# Patient Record
Sex: Male | Born: 1965 | Race: White | Marital: Single | Smoking: Current every day smoker
Health system: Southern US, Community
[De-identification: ages and names within clinical notes are randomized; demographics above are authoritative.]

## PROBLEM LIST (undated history)

## (undated) DIAGNOSIS — I10 Essential (primary) hypertension: Secondary | ICD-10-CM

## (undated) DIAGNOSIS — Q798 Other congenital malformations of musculoskeletal system: Secondary | ICD-10-CM

## (undated) DIAGNOSIS — F329 Major depressive disorder, single episode, unspecified: Secondary | ICD-10-CM

## (undated) DIAGNOSIS — F32A Depression, unspecified: Secondary | ICD-10-CM

## (undated) DIAGNOSIS — K219 Gastro-esophageal reflux disease without esophagitis: Secondary | ICD-10-CM

## (undated) DIAGNOSIS — F419 Anxiety disorder, unspecified: Secondary | ICD-10-CM

## (undated) DIAGNOSIS — F1110 Opioid abuse, uncomplicated: Secondary | ICD-10-CM

## (undated) DIAGNOSIS — E785 Hyperlipidemia, unspecified: Secondary | ICD-10-CM

## (undated) HISTORY — PX: HAND SURGERY: SHX662

## (undated) HISTORY — PX: TONSILLECTOMY: SUR1361

---

## 2010-12-31 ENCOUNTER — Other Ambulatory Visit: Payer: Self-pay | Admitting: Nephrology

## 2010-12-31 ENCOUNTER — Ambulatory Visit
Admission: RE | Admit: 2010-12-31 | Discharge: 2010-12-31 | Disposition: A | Discharge status: Home / Self Care | Payer: 59 | Source: Ambulatory Visit | Attending: Nephrology | Admitting: Nephrology

## 2010-12-31 DIAGNOSIS — F17200 Nicotine dependence, unspecified, uncomplicated: Secondary | ICD-10-CM

## 2011-08-24 ENCOUNTER — Ambulatory Visit
Admission: RE | Admit: 2011-08-24 | Discharge: 2011-08-24 | Disposition: A | Discharge status: Home / Self Care | Payer: BC Managed Care – PPO | Source: Ambulatory Visit | Attending: Nephrology | Admitting: Nephrology

## 2011-08-24 ENCOUNTER — Other Ambulatory Visit: Payer: Self-pay | Admitting: Nephrology

## 2011-08-24 DIAGNOSIS — R52 Pain, unspecified: Secondary | ICD-10-CM

## 2012-01-04 ENCOUNTER — Emergency Department (HOSPITAL_COMMUNITY)
Admission: EM | Admit: 2012-01-04 | Discharge: 2012-01-05 | Disposition: A | Discharge status: Other Acute Inpatient Hospital | Payer: BC Managed Care – PPO | Attending: Emergency Medicine | Admitting: Emergency Medicine

## 2012-01-04 ENCOUNTER — Encounter (HOSPITAL_COMMUNITY): Payer: Self-pay | Admitting: *Deleted

## 2012-01-04 DIAGNOSIS — F17200 Nicotine dependence, unspecified, uncomplicated: Secondary | ICD-10-CM | POA: Insufficient documentation

## 2012-01-04 DIAGNOSIS — F3289 Other specified depressive episodes: Secondary | ICD-10-CM | POA: Insufficient documentation

## 2012-01-04 DIAGNOSIS — F329 Major depressive disorder, single episode, unspecified: Secondary | ICD-10-CM | POA: Insufficient documentation

## 2012-01-04 DIAGNOSIS — Z79899 Other long term (current) drug therapy: Secondary | ICD-10-CM | POA: Insufficient documentation

## 2012-01-04 LAB — CBC
MCV: 89.5 fL (ref 78.0–100.0)
Platelets: 307 10*3/uL (ref 150–400)

## 2012-01-04 LAB — RAPID URINE DRUG SCREEN, HOSP PERFORMED
Barbiturates: NOT DETECTED
Opiates: NOT DETECTED
Tetrahydrocannabinol: POSITIVE — AB

## 2012-01-04 LAB — COMPREHENSIVE METABOLIC PANEL
AST: 18 U/L (ref 0–37)
CO2: 29 mEq/L (ref 19–32)
Chloride: 98 mEq/L (ref 96–112)
Creatinine, Ser: 0.8 mg/dL (ref 0.50–1.35)
GFR calc non Af Amer: >90 mL/min (ref >90)
Total Bilirubin: 0.4 mg/dL (ref 0.3–1.2)

## 2012-01-04 MED ORDER — IBUPROFEN 200 MG PO TABS: 600.0000 mg | ORAL_TABLET | Freq: Three times a day (TID) | ORAL | Status: DC | PRN

## 2012-01-04 MED ORDER — ATORVASTATIN CALCIUM 10 MG PO TABS
10.0000 mg | ORAL_TABLET | Freq: Every day | ORAL | Status: DC
  Administered 2012-01-05: 10 mg via ORAL
  Filled 2012-01-04 (×2): qty 1

## 2012-01-04 MED ORDER — ONDANSETRON HCL 4 MG PO TABS: 4.0000 mg | ORAL_TABLET | Freq: Three times a day (TID) | ORAL | Status: DC | PRN

## 2012-01-04 MED ORDER — LORAZEPAM 1 MG PO TABS
1.0000 mg | ORAL_TABLET | Freq: Three times a day (TID) | ORAL | Status: DC | PRN
  Administered 2012-01-05 (×2): 1 mg via ORAL
  Filled 2012-01-04 (×2): qty 1

## 2012-01-04 MED ORDER — ZOLPIDEM TARTRATE 5 MG PO TABS: 5.0000 mg | ORAL_TABLET | Freq: Every evening | ORAL | Status: DC | PRN

## 2012-01-04 MED ORDER — QUETIAPINE FUMARATE 100 MG PO TABS
100.0000 mg | ORAL_TABLET | Freq: Every day | ORAL | Status: DC
  Administered 2012-01-05: 100 mg via ORAL
  Filled 2012-01-04: qty 1

## 2012-01-04 MED ORDER — NICOTINE 21 MG/24HR TD PT24
21.0000 mg | MEDICATED_PATCH | Freq: Every day | TRANSDERMAL | Status: DC
  Administered 2012-01-05: 21 mg via TRANSDERMAL
  Filled 2012-01-04 (×2): qty 1

## 2012-01-04 MED ORDER — POTASSIUM CHLORIDE CRYS ER 20 MEQ PO TBCR
40.0000 meq | EXTENDED_RELEASE_TABLET | Freq: Once | ORAL | Status: AC
  Administered 2012-01-04: 40 meq via ORAL
  Filled 2012-01-04: qty 2

## 2012-01-04 MED ORDER — PANTOPRAZOLE SODIUM 40 MG PO TBEC
80.0000 mg | DELAYED_RELEASE_TABLET | Freq: Every day | ORAL | Status: DC
  Administered 2012-01-04 – 2012-01-05 (×2): 80 mg via ORAL
  Filled 2012-01-04 (×2): qty 2

## 2012-01-04 MED ORDER — VILAZODONE HCL 10 MG PO TABS
10.0000 mg | ORAL_TABLET | Freq: Every day | ORAL | Status: DC
  Administered 2012-01-05: 10 mg via ORAL
  Filled 2012-01-04: qty 1

## 2012-01-04 MED ORDER — VILAZODONE HCL 40 MG PO TABS: 40.0000 mg | ORAL_TABLET | Freq: Every day | ORAL | Status: DC

## 2012-01-04 MED ORDER — VILAZODONE HCL 20 MG PO TABS: 20.0000 mg | ORAL_TABLET | Freq: Every day | ORAL | Status: DC

## 2012-01-04 NOTE — ED Provider Notes (Signed)
History     CSN: [id number]  Arrival date & time 01/04/12  1858   First MD Initiated Contact with Patient 01/04/12 2203      Chief Complaint  Patient presents with  . Medical Clearance    (Consider location/radiation/quality/duration/timing/severity/associated sxs/prior treatment) HPI History from patient. 46 year old male presents for medical clearance. He has a past medical history of depression. He reports that he has been abusing prescription narcotics, especially oxycodone, for the past 3 years now. He has tried to "get clean" on his own for the past several weeks but hasn't been able to do so. He reports that he has felt increasingly depressed because he feels as if "I have a lot to lose if I don't get clean." He denies any suicidal or homicidal ideation at this time. States that he recently saw psychiatry on Friday of this week and was started on Viibryd for his depression. He has taken this for the past 3 days as prescribed. Reports he has had a decreased appetite for the past several days which is due to withdrawal. He reports chills as well. Denies any other somatic complaints at this time, specifically no cough, congestion, chest or abdominal pain, urinary symptoms, skin infection, weight loss, night sweats. History reviewed. No pertinent past medical history.  History reviewed. No pertinent past surgical history.  No family history on file.  History  Substance Use Topics  . Smoking status: Current Everyday Smoker -- 2.0 packs/day  . Smokeless tobacco: Not on file  . Alcohol Use: No      Review of Systems  All other systems reviewed and are negative.    Allergies  Review of patient's allergies indicates no known allergies.  Home Medications   Current Outpatient Rx  Name Route Sig Dispense Refill  . ATORVASTATIN CALCIUM 20 MG PO TABS Oral Take 10 mg by mouth daily.    Marland Kitchen CLONAZEPAM 1 MG PO TABS Oral Take 1 mg by mouth 3 (three) times daily as needed. For anxiety     . OMEPRAZOLE 40 MG PO CPDR Oral Take 40 mg by mouth daily.    . OXYCODONE HCL 15 MG PO TABS Oral Take 15 mg by mouth 5 (five) times daily.    . QUETIAPINE FUMARATE 100 MG PO TABS Oral Take 100-200 mg by mouth daily.    Marland Kitchen VILAZODONE HCL 10 MG PO TABS Oral Take 10-40 mg by mouth See admin instructions. Started pack days 1-7 take 10 mg then take 20 mg days 8-14 then 40 mg days 15-30.      BP 146/102  Pulse 99  Temp 98.4 F (36.9 C) (Oral)  Resp 26  Ht 5\' 9"  (1.753 m)  Wt 140 lb (63.504 kg)  BMI 20.67 kg/m2  SpO2 100%  Physical Exam  Nursing note and vitals reviewed. Constitutional: He is oriented to person, place, and time. He appears well-developed and well-nourished. No distress.  HENT:  Head: Normocephalic and atraumatic.  Mouth/Throat: Oropharynx is clear and moist. No oropharyngeal exudate.  Eyes: EOM are normal. Pupils are equal, round, and reactive to light.       Normal appearance  Neck: Normal range of motion. Neck supple.  Cardiovascular: Normal rate, regular rhythm and normal heart sounds.  Exam reveals no gallop and no friction rub.   No murmur heard. Pulmonary/Chest: Effort normal and breath sounds normal. He has no wheezes. He exhibits no tenderness.  Abdominal: Soft. Bowel sounds are normal. There is no tenderness. There is no rebound and no  guarding.  Musculoskeletal: Normal range of motion.  Lymphadenopathy:    He has no cervical adenopathy.  Neurological: He is alert and oriented to person, place, and time. No cranial nerve deficit.  Skin: Skin is warm and dry. He is not diaphoretic.  Psychiatric: He has a normal mood and affect.    ED Course  Procedures (including critical care time)  Labs Reviewed  CBC - Abnormal; Notable for the following:    WBC 20.3 (*)     All other components within normal limits  COMPREHENSIVE METABOLIC PANEL - Abnormal; Notable for the following:    Potassium 2.9 (*)     Glucose, Bld 107 (*)     BUN 3 (*)     All other  components within normal limits  URINE RAPID DRUG SCREEN (HOSP PERFORMED) - Abnormal; Notable for the following:    Tetrahydrocannabinol POSITIVE (*)     All other components within normal limits  ETHANOL   No results found.   No diagnosis found.    MDM  Patient is medically cleared for evaluation by the ACT team. I discussed with Terri. Patient does have an elevated white count, but both the patient and his wife report that this is a chronic issue and his levels have been elevated during his annual physicals for the past several years. No old values in the chart here to be compared to.He has had evaluation by his PCP without a definitive etiology discovered. Patient denies any somatic symptoms that would suggest infection or oncologic process. Will put in an order for repeat CBC to be drawn in the morning to evaluate for trending. Patient calm and cooperative at this time and verbalizes understanding of the process.        Grant Fontana, PA-C 01/04/12 2256  Grant Fontana, PA-C 01/04/12 2257

## 2012-01-04 NOTE — ED Notes (Signed)
Pt placed in scrubs; valuables and meds given to wife to take home; pt wanded by security

## 2012-01-04 NOTE — ED Notes (Signed)
Pt requesting help for detox from oxycodone; states is depressed; denies suicidal ideations.  Saw psychiatrist on Friday and restarted on meds Viibryd/seroquel

## 2012-01-04 NOTE — ED Notes (Signed)
Pt wants helps with medication and with not eating.  Pt states he has been addictive to medication for three years.  He has been feeling depressed and not wanting to get out the house.  Pt states he is feeling severely depressed.

## 2012-01-05 ENCOUNTER — Inpatient Hospital Stay (HOSPITAL_COMMUNITY)
Admission: AD | Admit: 2012-01-05 | Discharge: 2012-01-10 | DRG: 745 | Disposition: A | Discharge status: Home / Self Care | Payer: BC Managed Care – PPO | Source: Ambulatory Visit | Attending: Psychiatry | Admitting: Psychiatry

## 2012-01-05 ENCOUNTER — Encounter (HOSPITAL_COMMUNITY): Payer: Self-pay

## 2012-01-05 DIAGNOSIS — F19939 Other psychoactive substance use, unspecified with withdrawal, unspecified: Principal | ICD-10-CM | POA: Diagnosis present

## 2012-01-05 DIAGNOSIS — F1120 Opioid dependence, uncomplicated: Secondary | ICD-10-CM | POA: Diagnosis present

## 2012-01-05 DIAGNOSIS — F329 Major depressive disorder, single episode, unspecified: Secondary | ICD-10-CM | POA: Diagnosis present

## 2012-01-05 DIAGNOSIS — F1210 Cannabis abuse, uncomplicated: Secondary | ICD-10-CM | POA: Diagnosis present

## 2012-01-05 DIAGNOSIS — F3289 Other specified depressive episodes: Secondary | ICD-10-CM | POA: Diagnosis present

## 2012-01-05 HISTORY — DX: Depression, unspecified: F32.A

## 2012-01-05 HISTORY — DX: Major depressive disorder, single episode, unspecified: F32.9

## 2012-01-05 LAB — BASIC METABOLIC PANEL
GFR calc Af Amer: >90 mL/min (ref >90)
GFR calc non Af Amer: >90 mL/min (ref >90)
Glucose, Bld: 137 mg/dL — ABNORMAL HIGH (ref 70–99)
Potassium: 3.5 mEq/L (ref 3.5–5.1)
Sodium: 137 mEq/L (ref 135–145)

## 2012-01-05 MED ORDER — ONDANSETRON 4 MG PO TBDP: 4.0000 mg | ORAL_TABLET | Freq: Four times a day (QID) | ORAL | Status: DC | PRN

## 2012-01-05 MED ORDER — ACETAMINOPHEN 325 MG PO TABS: 650.0000 mg | ORAL_TABLET | Freq: Four times a day (QID) | ORAL | Status: DC | PRN

## 2012-01-05 MED ORDER — ALUM & MAG HYDROXIDE-SIMETH 200-200-20 MG/5ML PO SUSP: 30.0000 mL | ORAL | Status: DC | PRN

## 2012-01-05 MED ORDER — THIAMINE HCL 100 MG/ML IJ SOLN: 100.0000 mg | Freq: Once | INTRAMUSCULAR | Status: DC

## 2012-01-05 MED ORDER — LOPERAMIDE HCL 2 MG PO CAPS
2.0000 mg | ORAL_CAPSULE | ORAL | Status: DC | PRN
  Administered 2012-01-06: 4 mg via ORAL

## 2012-01-05 MED ORDER — HYDROXYZINE HCL 25 MG PO TABS: 25.0000 mg | ORAL_TABLET | Freq: Four times a day (QID) | ORAL | Status: DC | PRN

## 2012-01-05 MED ORDER — CHLORDIAZEPOXIDE HCL 25 MG PO CAPS
50.0000 mg | ORAL_CAPSULE | Freq: Once | ORAL | Status: AC
  Administered 2012-01-05: 50 mg via ORAL
  Filled 2012-01-05: qty 2

## 2012-01-05 MED ORDER — NAPROXEN 500 MG PO TABS: 500.0000 mg | ORAL_TABLET | Freq: Two times a day (BID) | ORAL | Status: DC | PRN

## 2012-01-05 MED ORDER — PANTOPRAZOLE SODIUM 40 MG PO TBEC
80.0000 mg | DELAYED_RELEASE_TABLET | Freq: Every day | ORAL | Status: DC
  Administered 2012-01-06 – 2012-01-10 (×5): 80 mg via ORAL
  Filled 2012-01-05 (×7): qty 2

## 2012-01-05 MED ORDER — DICYCLOMINE HCL 20 MG PO TABS: 20.0000 mg | ORAL_TABLET | Freq: Four times a day (QID) | ORAL | Status: DC | PRN

## 2012-01-05 MED ORDER — CHLORDIAZEPOXIDE HCL 25 MG PO CAPS
25.0000 mg | ORAL_CAPSULE | Freq: Four times a day (QID) | ORAL | Status: AC | PRN
  Administered 2012-01-06: 25 mg via ORAL
  Filled 2012-01-05: qty 1

## 2012-01-05 MED ORDER — TRAZODONE HCL 50 MG PO TABS
50.0000 mg | ORAL_TABLET | Freq: Every evening | ORAL | Status: DC | PRN
Start: 2012-01-05 — End: 2012-01-07
  Administered 2012-01-05 – 2012-01-06 (×3): 50 mg via ORAL
  Filled 2012-01-05 (×8): qty 1

## 2012-01-05 MED ORDER — LOPERAMIDE HCL 2 MG PO CAPS: 2.0000 mg | ORAL_CAPSULE | ORAL | Status: DC | PRN

## 2012-01-05 MED ORDER — METHOCARBAMOL 500 MG PO TABS: 500.0000 mg | ORAL_TABLET | Freq: Three times a day (TID) | ORAL | Status: DC | PRN

## 2012-01-05 MED ORDER — MAGNESIUM HYDROXIDE 400 MG/5ML PO SUSP: 30.0000 mL | Freq: Every day | ORAL | Status: DC | PRN

## 2012-01-05 MED ORDER — HYDROXYZINE HCL 25 MG PO TABS
25.0000 mg | ORAL_TABLET | Freq: Four times a day (QID) | ORAL | Status: DC | PRN
  Administered 2012-01-06 – 2012-01-09 (×4): 25 mg via ORAL
  Filled 2012-01-05 (×3): qty 1

## 2012-01-05 MED ORDER — VITAMIN B-1 100 MG PO TABS
100.0000 mg | ORAL_TABLET | Freq: Every day | ORAL | Status: DC
  Administered 2012-01-06 – 2012-01-10 (×5): 100 mg via ORAL
  Filled 2012-01-05 (×7): qty 1

## 2012-01-05 MED ORDER — ADULT MULTIVITAMIN W/MINERALS CH
1.0000 | ORAL_TABLET | Freq: Every day | ORAL | Status: DC
  Administered 2012-01-06 – 2012-01-10 (×5): 1 via ORAL
  Filled 2012-01-05 (×8): qty 1

## 2012-01-05 NOTE — Tx Team (Signed)
Initial Interdisciplinary Treatment Plan  PATIENT STRENGTHS: (choose at least two) Ability for insight Active sense of humor Average or above average intelligence Capable of independent living Communication skills Financial means General fund of knowledge Motivation for treatment/growth  PATIENT STRESSORS: Substance abuse   PROBLEM LIST: Problem List/Patient Goals Date to be addressed Date deferred Reason deferred Estimated date of resolution  Depression      Substance Abuse                                                 DISCHARGE CRITERIA:  Motivation to continue treatment in a less acute level of care  PRELIMINARY DISCHARGE PLAN: Outpatient therapy  PATIENT/FAMIILY INVOLVEMENT: This treatment plan has been presented to and reviewed with the patient, Tracy Gilbert, and/or family member.  The patient and family have been given the opportunity to ask questions and make suggestions.  [name] [facility] 01/05/2012, 10:30 PM

## 2012-01-05 NOTE — ED Provider Notes (Signed)
Medical screening examination/treatment/procedure(s) were performed by non-physician practitioner and as supervising physician I was immediately available for consultation/collaboration.   Gerhard Munch, MD 01/05/12 0005

## 2012-01-05 NOTE — ED Notes (Signed)
Patient is resting comfortably. 

## 2012-01-05 NOTE — Progress Notes (Signed)
Patient ID: Tracy Gilbert, male   DOB: [birth year], 46 y.o.   MRN: [id number] Pt denies SI/HI/AVH. Pt admitted today for detox oxycodone. Pt has been consuming it for 3 years. Pt states that he cut his L middle finger off and was prescribed oxycodone for pain management and abuse started from there. Pt states that he is prescribed 150 - 15mg  pills monthly. Pt states that he wants to go to a new doctor that will not allow him to have that amount of medication. Pt states that he takes 5-7 daily depending on his pain. Pt states pain originates from bone spurs in foot and elbow. He feels that the oxycodone may have been making it worse. States that he has reduced his intake to 2-3 daily on his own. Pt states that he has been majorly depressed lately with no drive to do anything.  UDS positive for THC. Pt's mother was an alcoholic.

## 2012-01-05 NOTE — ED Provider Notes (Signed)
Patient is here for depression and narcotic abuse. Patient states he needs a nicotine patch, he states he smokes 2 packs per day. His orders were reviewed he has one written. Nurse states he refused it last night as he wanted to sleep. Act team was notified at 07:42 he needs evaluation.  Devoria Albe, MD, FACEP   Ward Givens, MD 01/05/12 [id number]

## 2012-01-05 NOTE — BH Assessment (Signed)
Assessment Note   Tracy Gilbert is an 46 y.o. male. Patient presents to the ED requesting detox from oxycodone. Patient reports motivation as "I need to get my life back." His substance abuse has caused challenges and conflict at work and with his fiancee.  Patient is prescribed oxycodone from his PCP, and taking 75 mg every morning.  Patient reports last use amt of 15 mg in the morning of 7/22.  Patient also reports depression but denies SI as well as HI and AVH. Patient states his PCP prescribed him Seroquel, but that was not relieving his symptoms. Patient reports having appt to see Dr. Dub Mikes last Friday and being seen by his PA "Trey Paula" who prescribed him vilazodone.   Patient reports a supportive fiancee and supervisor and advised his HR department has arranged for him to go to [facility], however, he will need detox first.    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 No pertinent past medical history. Axis IV: other psychosocial or environmental problems Axis V: 51-60 moderate symptoms  Past Medical History: History reviewed. No pertinent past medical history.  History reviewed. No pertinent past surgical history.  Family History: No family history on file.  Social History:  reports that he has been smoking.  He does not have any smokeless tobacco history on file. He reports that he uses illicit drugs (Marijuana). He reports that he does not drink alcohol.  Additional Social History:  Alcohol / Drug Use History of alcohol / drug use?: Yes Longest period of sobriety (when/how long): None Negative Consequences of Use: Financial;Work / School;Personal relationships Withdrawal Symptoms: Agitation;Sweats Substance #1 Name of Substance 1: Oxycodone 1 - Age of First Use: 30s 1 - Amount (size/oz): 75 mg (15 mg tabs) 1 - Frequency: Daily 1 - Duration: Years 1 - Last Use / Amount: (15 mg) 01/04/2012  CIWA: CIWA-Ar BP: 130/83 mmHg Pulse  Rate: 104  COWS:    Allergies: No Known Allergies  Home Medications:  (Not in a hospital admission)  OB/GYN Status:  No LMP for male patient.  General Assessment Data Location of Assessment: [facility] ED Living Arrangements: Spouse/significant other Can pt return to current living arrangement?: Yes Admission Status: Voluntary Is patient capable of signing voluntary admission?: Yes Transfer from: Acute Hospital Referral Source: Self/Family/Friend     Risk to self Suicidal Ideation: No Suicidal Intent: No Is patient at risk for suicide?: No Suicidal Plan?: No Access to Means: No What has been your use of drugs/alcohol within the last 12 months?: Oxycodone, 75 mg, daily, years Previous Attempts/Gestures: No Triggers for Past Attempts: None known Intentional Self Injurious Behavior: None Family Suicide History: No Recent stressful life event(s): Other (Comment) (Substance abuse) Persecutory voices/beliefs?: No Depression: Yes Depression Symptoms: Despondent;Feeling worthless/self pity;Loss of interest in usual pleasures;Isolating Substance abuse history and/or treatment for substance abuse?: No Suicide prevention information given to non-admitted patients: Not applicable  Risk to Others Homicidal Ideation: No Thoughts of Harm to Others: No Current Homicidal Intent: No Current Homicidal Plan: No Access to Homicidal Means: No History of harm to others?: No Assessment of Violence: None Noted Does patient have access to weapons?: No Criminal Charges Pending?: No Does patient have a court date: No  Psychosis Hallucinations: None noted Delusions: None noted  Mental Status Report Appear/Hygiene: Disheveled Eye Contact: Fair Motor Activity: Freedom of movement Speech: Logical/coherent;Rapid Level of Consciousness: Alert Mood: Anxious Affect: Appropriate to circumstance Anxiety Level: Minimal Thought Processes: Relevant;Coherent Judgement: Unimpaired  Orientation:  Person;Place;Situation;Time Obsessive Compulsive Thoughts/Behaviors: None  Cognitive Functioning Concentration: Decreased Memory: Remote Intact;Recent Intact IQ: Average Insight: Good Impulse Control: Poor Appetite: Good Sleep: No Change Vegetative Symptoms: None  ADLScreening [facility] Assessment Services) Patient's cognitive ability adequate to safely complete daily activities?: Yes Patient able to express need for assistance with ADLs?: Yes Independently performs ADLs?: Yes  Abuse/Neglect [facility]) Physical Abuse: Denies Verbal Abuse: Denies Sexual Abuse: Denies  Prior Inpatient Therapy Prior Inpatient Therapy: No  Prior Outpatient Therapy Prior Outpatient Therapy: Yes Prior Therapy Dates: 01/03/2012 Prior Therapy Facilty/Provider(s): Dr. Runell Gess Office ([address] [place], [place]) Reason for Treatment: Med management  ADL Screening (condition at time of admission) Patient's cognitive ability adequate to safely complete daily activities?: Yes Patient able to express need for assistance with ADLs?: Yes Independently performs ADLs?: Yes       Abuse/Neglect Assessment (Assessment to be complete while patient is alone) Physical Abuse: Denies Verbal Abuse: Denies Sexual Abuse: Denies Values / Beliefs Cultural Requests During Hospitalization: None Spiritual Requests During Hospitalization: None        Additional Information 1:1 In Past 12 Months?: No CIRT Risk: No Elopement Risk: No Does patient have medical clearance?: Yes     Disposition:  Disposition Disposition of Patient: Inpatient treatment program Type of inpatient treatment program: Adult  On Site Evaluation by:   Reviewed with Physician:     Marlaine Hind ANN S 01/05/2012 1:17 PM

## 2012-01-05 NOTE — BHH Counselor (Signed)
Psychiatrist, Dr. Lucianne Muss requesting Fed Rate and BMT test. Writer contacted patient's nurse-Cynthia and made her aware of Dr. Remus Blake request.

## 2012-01-06 DIAGNOSIS — F1120 Opioid dependence, uncomplicated: Secondary | ICD-10-CM | POA: Diagnosis present

## 2012-01-06 MED ORDER — CITALOPRAM HYDROBROMIDE 20 MG PO TABS: 20.0000 mg | ORAL_TABLET | Freq: Every day | ORAL | Status: DC

## 2012-01-06 MED ORDER — NICOTINE 21 MG/24HR TD PT24
21.0000 mg | MEDICATED_PATCH | Freq: Every day | TRANSDERMAL | Status: DC
  Administered 2012-01-06 – 2012-01-10 (×5): 21 mg via TRANSDERMAL
  Filled 2012-01-06 (×9): qty 1

## 2012-01-06 MED ORDER — CLONIDINE HCL 0.1 MG PO TABS
0.1000 mg | ORAL_TABLET | ORAL | Status: AC
  Administered 2012-01-08 – 2012-01-10 (×4): 0.1 mg via ORAL
  Filled 2012-01-06 (×4): qty 1

## 2012-01-06 MED ORDER — CLONIDINE HCL 0.1 MG PO TABS
0.1000 mg | ORAL_TABLET | Freq: Four times a day (QID) | ORAL | Status: AC
  Administered 2012-01-06 – 2012-01-08 (×10): 0.1 mg via ORAL
  Filled 2012-01-06 (×11): qty 1

## 2012-01-06 MED ORDER — PAROXETINE HCL 20 MG PO TABS
20.0000 mg | ORAL_TABLET | Freq: Every day | ORAL | Status: DC
  Administered 2012-01-06 – 2012-01-10 (×5): 20 mg via ORAL
  Filled 2012-01-06 (×3): qty 1
  Filled 2012-01-06: qty 14
  Filled 2012-01-06 (×3): qty 1
  Filled 2012-01-06: qty 14
  Filled 2012-01-06: qty 1

## 2012-01-06 MED ORDER — CLONIDINE HCL 0.1 MG PO TABS
0.1000 mg | ORAL_TABLET | Freq: Every day | ORAL | Status: DC
  Filled 2012-01-06 (×2): qty 1

## 2012-01-06 NOTE — Progress Notes (Signed)
Patient ID: Tracy Gilbert, male   DOB: 03/24/1966, 46 y.o.   MRN: [id number]  D:  Patient was pleasant and cooperative during the assessment.  Was interacting appropriately with staff as well as peers. Pt voiced no questions or concerns, but did ask if we could give his roommate something for snoring.pt stated while laughing. Writer apologized and asked pt if he still had the ear plugs she'd given him last night. Pt did and stated he would be "ok".   A: Pt given scheduled meds. Support and encouragement was offered. 15 min checks were continued.  R:  Pt remains safe.
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
## 2012-01-06 NOTE — Progress Notes (Signed)
[facility] Group Notes:  (Counselor/Nursing/MHT/Case Management/Adjunct)  01/06/2012 3:31 PM  Type of Therapy:  Group Therapy at 1:15 to 2:30PM  Participation Level:  Active  Participation Quality:  Attentive and Sharing  Affect:  Appropriate  Cognitive:  Alert and Oriented  Insight:  Limited  Engagement in Group:  Good  Engagement in Therapy:  Limited  Modes of Intervention:  Clarification, Socialization and Support  Summary of Progress/Problems: The focus of this group session was to process how we deal with difficult emotions and share with others the patterns that play out when we are reacting to the emotion verses the situation.  "Tracy Gilbert" shared that one of the most difficult emotions he deals with is depression, anxiety and feelings of disapproval from others.  Patient shared how stress has been his excuse to use more and more of late. 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      [name] 01/06/2012, 3:31 PM

## 2012-01-06 NOTE — Progress Notes (Signed)
D:  Patient reports that his energy level is low and his ability to pay attention is improving.  He rates depression at 5/10.  He denies SI/HI/AVH, but does complain of some anxiety at times.  He is up and participating in groups, interacting with patients and other staff. A: Administered medications as ordered.  Provided emotional support. Monitored for signs and symptoms of withdrawal.  R:  Safety maintained on unit.

## 2012-01-06 NOTE — Discharge Planning (Signed)
New patient attended AM group, good participation.  States he is here due to opiate dependence [snorting hydrocodone].  Also been depressed, to the point that he has basically been in bed for 2 weeks calling in sick.  Finally called his work partner and asked for help getting help.  Has several adult children and a grandchild.  States he is a patient of Dr Dub Mikes.  Is contemplating either rehab or IOP.
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
## 2012-01-07 MED ORDER — TRAZODONE HCL 150 MG PO TABS
150.0000 mg | ORAL_TABLET | Freq: Every evening | ORAL | Status: DC | PRN
  Administered 2012-01-07 – 2012-01-09 (×3): 150 mg via ORAL
  Filled 2012-01-07 (×2): qty 1
  Filled 2012-01-07: qty 3
  Filled 2012-01-07 (×8): qty 1

## 2012-01-07 NOTE — H&P (Signed)
Pt seen and evaluated upon admission.  Completed Admission Suicide Risk Assessment.  See orders.  Pt agreeable with plan.  Discussed with team.   

## 2012-01-07 NOTE — Discharge Planning (Signed)
Today Tracy Gilbert is introspective, talks of wanting to get his life back.  Minimal withdrawal symptoms.  C/O on-going depression.  I reminded him progress will be slow and incremental.  Accepting.  States he has not yet thought of post d/c plan.  Told him he needed to.  Introduced him to [place] who talked to him about [facility] IOP.  Will continue to push him in that direction.

## 2012-01-07 NOTE — Treatment Plan (Signed)
Interdisciplinary Treatment Plan Update (Adult)  Date: 01/07/2012  Time Reviewed: 2:20 PM   Progress in Treatment: Attending groups: Yes Participating in groups: Yes Taking medication as prescribed: Yes Tolerating medication: Yes   Family/Significant other contact made:  No Patient understands diagnosis:  Yes  As evidenced by stating he needs help for his depression, addiction Discussing patient identified problems/goals with staff:  Yes  See below Medical problems stabilized or resolved:  Yes Denies suicidal/homicidal ideation: Yes  In AM group Issues/concerns per patient self-inventory:  None Other:  New problem(s) identified: N/A  Reason for Continuation of Hospitalization: Depression Medication stabilization Withdrawal symptoms  Interventions implemented related to continuation of hospitalization: Paxil trial  Clonodine taper  Encourage group attendance and participation  Additional comments:  Estimated length of stay: 2-3 days  Discharge Plan: Not yet identified  New goal(s): N/A  Review of initial/current patient goals per problem list:   1.  Goal(s):Safely detox from opiates  Met:  No  Target date:7/27  As evidenced ZO:XWRUEA vitals, no withdrawal symptoms  2.  Goal (s): Stabilize mood  Met:  No  Target date:7/27  As evidenced VW:[id number] will report that his depression is improving and manageable  3.  Goal(s):Identify comprehensive sobriety plan  Met:  No  Target date:7/26  As evidenced YN:WGNF report  4.  Goal(s):  Met:  No  Target date:  As evidenced by:  Attendees: Patient:     Family:     Physician:  Lupe Carney 01/07/2012 2:20 PM   Nursing:    01/07/2012 2:20 PM   Case Manager:  Richelle Ito, LCSW 01/07/2012 2:20 PM   Counselor:   01/07/2012 2:20 PM   Other:     Other:     Other:     Other:      Scribe for Treatment Team:   Ida Rogue, 01/07/2012 2:20 PM
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
## 2012-01-07 NOTE — Progress Notes (Signed)
[facility] Adult Inpatient Family/Significant Other Collateral Contact  Collateral Contact:  Education Completed; Bishop Dublin at [phone] has been identified by the patient as the family member/significant other who can provide for collateral information. With written consent from the patient, the family member/significant other has been contacted and reports:   That she has concern regarding patient's usual pattern which involves crisis such as he is currently experiencing, feelings of shame and remorse followed by gradually coming out of his shell, and then becoming so active he works his way into another crisis.   Baxter Hire feels patient has never properly been treated for his anxiety attacks, insomnia, and AD/HD symptoms.   "He is either all the way down or all the way up."  Ms Butch Penny also reports patient appears sedated and inquired about expected discharge date.   Writer provided phone number and   [facility] Crisis Unit telephone number   [name] 01/07/2012 3:49 PM

## 2012-01-07 NOTE — Progress Notes (Signed)
Psychoeducational Group Note  Date:  01/07/2012 Time:  1100  Group Topic/Focus:  Overcoming Stress:   The focus of this group is to define stress and help patients assess their triggers.  Participation Level:  Active  Participation Quality:  Appropriate  Affect:  Appropriate  Cognitive:  Appropriate  Insight:  Good  Engagement in Group:  Good  Additional Comments:  none  Oneta Sigman M 01/07/2012, 1:09 PM

## 2012-01-07 NOTE — Progress Notes (Signed)
[facility] MD Progress Note  01/07/2012 4:15 PM   S/O: Patient seen and evaluated. Chart reviewed. Patient stated that his mood was "depressed". His affect was mood congruent and anxious. He denied any current thoughts of self injurious behavior, suicidal ideation or homicidal ideation. Hx reported anxiety, panic attacks and depressive s/s (yet, in context of using sig amounts of opioids and cannabis). There were no auditory or visual hallucinations, paranoia, delusional thought processes, or mania noted. Thought process was linear and goal directed. No psychomotor agitation or retardation was noted. His speech was normal rate, tone and volume. Eye contact was good. Judgment and insight are fair. Patient has been up and engaged on the unit. No acute safety concerns reported from team.   Sleep:  Number of Hours: 5.25    Vital Signs:Blood pressure 106/73, pulse 74, temperature 97.1 F (36.2 C), temperature source Oral, resp. rate 18, height 5' 7.5" (1.715 m), weight 61.236 kg (135 lb).  Current Medications:    . cloNIDine  0.1 mg Oral QID   Followed by  . cloNIDine  0.1 mg Oral BH-qamhs   Followed by  . cloNIDine  0.1 mg Oral QAC breakfast  . multivitamin with minerals  1 tablet Oral Daily  . nicotine  21 mg Transdermal Daily  . pantoprazole  80 mg Oral Q1200  . PARoxetine  20 mg Oral Daily  . thiamine  100 mg Intramuscular Once  . thiamine  100 mg Oral Daily  . traZODone  150 mg Oral QHS,MR X 1  . DISCONTD: traZODone  50 mg Oral QHS,MR X 1    Lab Results: No results found for this or any previous visit (from the past 48 hour(s)).  Physical Findings: CIWA:  CIWA-Ar Total: 0  COWS:  COWS Total Score: 5   A/P: Opioid Use & W/D Disorder; Cannabis Use Disorder; Depressive Disorder Unspecified; r/o PD NOS  Pt seen and evaluated.  Reviewed short term and long term goals, medications, current treatment in the hospital and acute/chronic safety.  Pt denied any current thoughts of self harm,  suicidal ideation or homicidal ideation.  Contracted for safety on the unit.  No acute issues noted.  VS reviewed with team.  Pt agreeable with treatment plan, see orders. Continue current medications as noted above.     Lupe Carney 01/07/2012, 4:15 PM

## 2012-01-07 NOTE — Progress Notes (Signed)
01/07/2012         Time: 1500      Group Topic/Focus: The focus of this group is on enhancing the patient's understanding of leisure, barriers to leisure, and the importance of engaging in positive leisure activities upon discharge for improved total health.  Participation Level: Active  Participation Quality: Attentive  Affect: Blunted  Cognitive: Oriented   Additional Comments: None.  Mackay Hanauer 01/07/2012 3:44 PM 

## 2012-01-07 NOTE — Progress Notes (Addendum)
D: Pt denies SI/HI/AV. Pt is pleasant and cooperative. Pt attends groups and actively participates with peers and staff. Pt has no withdrawal symptoms. Pt rates depression and hopelessness/helplessness at a 5.  A: Pt was offered support and encouragement. Pt was given scheduled medications. Pt was encourage to attend groups. Q 15 minute checks were done for safety.  R: Pt is taking medication. Pt has no complaints.Pt receptive to treatment and safety maintained on unit.

## 2012-01-07 NOTE — Progress Notes (Signed)
Patient ID: Tracy Gilbert, male   DOB: 01/06/1966, 46 y.o.   MRN: [id number] This patient did not go to [facility] but chose to stay in his bed.
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
## 2012-01-08 NOTE — Progress Notes (Signed)
[facility] Case Management Discharge Plan:  Will you be returning to the same living situation after discharge: No. At discharge, do you have transportation home?:Yes,  fiance Do you have the ability to pay for your medications:Yes,  insurance  Interagency Information:     Release of information consent forms completed and in the chart;  Patient's signature needed at discharge.  Patient to Follow up at:  Follow-up Information    [facility] up with [facility] on 01/10/2012. (Arrive at 2:30 for your intake)    Contact information:   [address]  [phone]         Patient denies SI/HI:   Yes,  yes    Aeronautical engineer and Suicide Prevention discussed:  Yes,  yes  Barrier to discharge identified:No.  Summary and Recommendations:   Tracy Gilbert 01/08/2012, 12:00 PM

## 2012-01-08 NOTE — Progress Notes (Signed)
Psychoeducational Group Note  Date:  01/08/2012 Time:  1100  Group Topic/Focus:  Relapse Prevention Planning:   The focus of this group is to define relapse and discuss the need for planning to combat relapse.  Participation Level:  None  Affect:  Appropriate and Flat  Cognitive:  Appropriate  Engagement in Group:  None  Additional Comments:  Pt attended Relapse Prevention Planning group, but pt did not participate in group.  Dalia Heading 01/08/2012, 3:32 PM

## 2012-01-08 NOTE — Progress Notes (Signed)
Patient ID: Tracy Gilbert, male   DOB: 1965-08-05, 46 y.o.   MRN: [id number]  Problem: Opioid Dependence  D: Pt isolative to his room and with no peer interaction. Pt endorses depression.  A: Monitor Q 15 minutes for safety, encourage staff/peer interaction and group participation. Administer medications as ordered by MD.  R: Pt compliant with HS medications, but refuses to attend group session. Pt with dull, flat affect but denies SI or plans to harm himself. Pt not vested in program or self improvement as he is not participating or out in milieu.

## 2012-01-08 NOTE — Progress Notes (Signed)
[facility] MD Progress Note  01/08/2012 9:38 PM   S/O: Patient seen and evaluated in team. Chart reviewed. Patient stated that his mood was "still depressed". His affect was mood congruent and anxious. He denied any current thoughts of self injurious behavior, suicidal ideation or homicidal ideation. Hx reported anxiety, panic attacks and depressive s/s (yet, in context of using sig amounts of opioids and cannabis). There were no auditory or visual hallucinations, paranoia, delusional thought processes, or mania noted. Thought process was linear and goal directed. No psychomotor agitation or retardation was noted. His speech was normal rate, tone and volume. Eye contact was good. Judgment and insight are fair. Patient has been up and engaged on the unit. No acute safety concerns reported from team.   Sleep:  Number of Hours: 6.75    Vital Signs:Blood pressure 100/66, pulse 77, temperature 98.2 F (36.8 C), temperature source Oral, resp. rate 16, height 5' 7.5" (1.715 m), weight 61.236 kg (135 lb).  Current Medications:     . cloNIDine  0.1 mg Oral QID   Followed by  . cloNIDine  0.1 mg Oral BH-qamhs   Followed by  . cloNIDine  0.1 mg Oral QAC breakfast  . multivitamin with minerals  1 tablet Oral Daily  . nicotine  21 mg Transdermal Daily  . pantoprazole  80 mg Oral Q1200  . PARoxetine  20 mg Oral Daily  . thiamine  100 mg Intramuscular Once  . thiamine  100 mg Oral Daily  . traZODone  150 mg Oral QHS,MR X 1    Lab Results: No results found for this or any previous visit (from the past 48 hour(s)).  Physical Findings: CIWA:  CIWA-Ar Total: 3  COWS:  COWS Total Score: 2   A/P: Opioid Use & W/D Disorder; Cannabis Use Disorder; Depressive Disorder Unspecified; r/o PD NOS  Pt seen and evaluated.  Reviewed short term and long term goals, medications, current treatment in the hospital and acute/chronic safety.  Pt denied any current thoughts of self harm, suicidal ideation or homicidal  ideation.  Contracted for safety on the unit.  No acute issues noted.  VS reviewed with team.  Pt agreeable with treatment plan, see orders. Continue current medications as noted above.  Pt willing to go to [facility].  Accepted for Sunday.     Lupe Carney 01/08/2012, 9:38 PM

## 2012-01-08 NOTE — Progress Notes (Signed)
[facility] Group Notes:  (Counselor/Nursing/MHT/Case Management/Adjunct)  01/08/2012 8:58 PM  Type of Therapy:  Psychoeducational Skills  Participation Level:  None  Participation Quality:  none  Affect:  Flat  Cognitive:  Alert  Insight:  None  Engagement in Group:  None  Engagement in Therapy:  None  Modes of Intervention:  Support  Summary of Progress/Problems:Pt refused to attend AA meeting tonight on the unit. Stated he wasn't feeling well. Tech notified his nurse.   Destiny Hagin L 01/08/2012, 8:58 PM

## 2012-01-08 NOTE — Progress Notes (Signed)
D:  Pt about on the unit today.  He states that he did not sleep that well last pm.  He had some withdrawal symptoms and states that his energy level is low and that his ability to pay attention is improving.  Denies SI/HI.  He rates his depression as a 4/10 and hopelessness as a 5/10.  He says that he feels better today than he did last pm.  Met with treatment team and may possibly be discharged this weekend.  He agrees to go to [facility] upon discharge. COWS--2.   He says that his mood changes from feeling good to being depressed during the day. Is attending groups A: RN offered support and encouragement. Given meds as prescribed. R:  Receptive to staff .  Remains on q 15 minutes for safety.  Will continue to monitor.

## 2012-01-08 NOTE — Progress Notes (Signed)
[facility] Group Notes:  (Counselor/Nursing/MHT/Case Management/Adjunct)  01/08/2012 3:26 PM  Type of Therapy:  Psychoeducational Skills  Participation Level:  Minimal  Participation Quality:  Appropriate  Affect:  Appropriate and Flat  Cognitive:  Appropriate  Insight:  Limited  Engagement in Group:  Limited  Engagement in Therapy:  Limited  Modes of Intervention:  Activity  Summary of Progress/Problems: Pt attended group on coping skills pictionary. Pts participation was limited.   Dalia Heading 01/08/2012, 3:26 PM

## 2012-01-08 NOTE — Discharge Planning (Addendum)
Tracy Gilbert confirmed that his SO wants him to go to [facility].  Although he is not ready to commit to that, he signed a release so I could send info. To them. In tx team this AM, Tracy Gilbert committed to [facility].  [name] called back confirming acceptance and a bed for Sunday at 2:30.  I let fiance and [name] know.

## 2012-01-08 NOTE — Progress Notes (Signed)
Patient ID: Tracy Gilbert, male   DOB: 1965-11-04, 46 y.o.   MRN: [id number]  Problem: Opioid Dependence  D: Pt withdrawn and isolative to his room. Pt with dull, flat affect endorsing depression.  A: Monitor patient Q 15 minutes for safety, encourage staff/peer interaction and group participation. Administer medications as ordered by MD.  R: Pt remains withdrawn and stayed in his room during shift. Pt declined to attend group. Pt compliant with medications and denies SI at this time.

## 2012-01-09 DIAGNOSIS — F1210 Cannabis abuse, uncomplicated: Secondary | ICD-10-CM

## 2012-01-09 DIAGNOSIS — F1120 Opioid dependence, uncomplicated: Secondary | ICD-10-CM

## 2012-01-09 DIAGNOSIS — F329 Major depressive disorder, single episode, unspecified: Secondary | ICD-10-CM

## 2012-01-09 NOTE — Progress Notes (Signed)
D: Pt denies SI/HI/AV. Pt is pleasant and cooperative. Pt rates depression at a 4, anxiety at a 5, and Helplessness/hopelessness at a 5. Pt is feeling a little nervous about going to rehab and it has him feeling a little anxious and scared.  A: Pt was offered support and encouragement. Pt was given scheduled medications. Pt was encourage to attend groups. Q 15 minute checks were done for safety.  R:Pt attendsgroups and interacts well with peers and staff. Pt attended the RN group and really opened up and discussed his feelings about rehab. Pt taking medication. Pt realizes that rehab is were he needs to go and that he has to get better for himself and he has people that depend on him. Pt has no complaints.Pt receptive to treatment and safety maintained on unit.

## 2012-01-09 NOTE — Progress Notes (Signed)
Patient ID: Delmo Matty, male   DOB: [birth year], 46 y.o.   MRN: [id number]  [facility] Group Notes:  (Counselor/Nursing/MHT/Case Management/Adjunct)  01/09/2012 1:15 PM  Type of Therapy:  Group Therapy, Dance/Movement Therapy   Participation Level:  Did Not Attend   Rhunette Croft 01/09/2012. 3:04 PM
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
## 2012-01-09 NOTE — Progress Notes (Signed)
Patient ID: Tracy Gilbert, male   DOB: 10/26/1965, 46 y.o.   MRN: [id number] Pt. attended and participated in aftercare planning group. Pt. Verbally accepted information on suicide prevention, warning signs to look for with suicide and crisis line numbers to use. The pt. agreed to call crisis line numbers if having warning signs or having thoughts of suicide. Pt. listed their current anxiety level as 2 and depression level as a 3 on a scale of 1 to 10 with 10 being the highest.  Pt. Indicated that he was notified by the doctor that he is a possible discharge for Sunday and that his fiance will be picking him up.

## 2012-01-09 NOTE — Progress Notes (Signed)
Pt just spoke with RN and feels medication is not working and he just can not think. Pt Said "I just dnt feel right". Pt stated he was having some anxiety. RN tried to reassure pt and also stated that if he is having medication issues that the doctor will see him tomorrow and he should discuss those issues. RN gave pt medication for anxiety.

## 2012-01-10 MED ORDER — PANTOPRAZOLE SODIUM 40 MG PO TBEC: 80.0000 mg | DELAYED_RELEASE_TABLET | Freq: Every day | ORAL | Status: DC

## 2012-01-10 MED ORDER — ATORVASTATIN CALCIUM 40 MG PO TABS
20.0000 mg | ORAL_TABLET | Freq: Every day | ORAL | Status: DC
  Filled 2012-01-10: qty 7

## 2012-01-10 MED ORDER — OMEPRAZOLE 40 MG PO CPDR: 40.0000 mg | DELAYED_RELEASE_CAPSULE | Freq: Every day | ORAL | Status: DC

## 2012-01-10 MED ORDER — ATORVASTATIN CALCIUM 10 MG PO TABS
10.0000 mg | ORAL_TABLET | Freq: Every day | ORAL | Status: DC
  Filled 2012-01-10: qty 14

## 2012-01-10 MED ORDER — ATORVASTATIN CALCIUM 20 MG PO TABS: 10.0000 mg | ORAL_TABLET | Freq: Every day | ORAL | Status: DC

## 2012-01-10 MED ORDER — PAROXETINE HCL 20 MG PO TABS: 20.0000 mg | ORAL_TABLET | Freq: Every day | ORAL | Status: DC

## 2012-01-10 NOTE — Progress Notes (Signed)
[facility] Case Management Discharge Plan:  Will you be returning to the same living situation after discharge: No. He will be discharging to [facility] from the hospital and then returning home to the home he shares with fiancee once discharged from [facility] At discharge, do you have transportation home?:Yes,  his fiancee is picking him up and transporting him to [facility] for intake.  Do you have the ability to pay for your medications:Yes,     Interagency Information:     Release of information consent forms completed and in the chart;  Patient's signature needed at discharge.  Patient to Follow up at:  Follow-up Information    Follow up with [facility] on 01/10/2012. (Arrive at 2:30 for your intake)    Contact information:   [address]  [phone]         Patient denies SI/HI:   Yes,       Aeronautical engineer and Suicide Prevention discussed:  Yes,  Suicide prevention was discussed and brochure was given to patient. Suicide Prevention was also discussed with fiancee and brochure was left for her at desk. Both pt and fiancee stated that there were no concerns related to SI/HI. Steffanie Rainwater agreed to secure the home of weapons and secure medications if becomes concerned about SI/HI. Steffanie Rainwater stated that he has never had a problem with being suicidal or homicidal in the past.   Barrier to discharge identified:No.  Summary and Recommendations: Counselor discussed discharge with pt and pt stated that he is looking forward to going to [facility] and getting into longer term treatment. He shared that his only concern is that his medication does not seem to be working effectively as it "wears off" half way through the day and his mind starts racing. Education provided to pt related to effects of drug addiction especially on the brain and the impact this has on long-term recovery and mood stability.    Shawna Orleans D Coble 01/10/2012, 10:30 AM

## 2012-01-10 NOTE — Progress Notes (Signed)
Psychoeducational Group Note  Date:  01/10/2012 Time:  0315  Group Topic/Focus:  Making Healthy Choices:   The focus of this group is to help patients identify negative/unhealthy choices they were using prior to admission and identify positive/healthier coping strategies to replace them upon discharge.  Participation Level:  Did Not Attend  Dalia Heading 01/10/2012, 4:48 PM

## 2012-01-10 NOTE — Progress Notes (Signed)
Psychoeducational Group Note  Date:  01/10/2012 Time:  0930   Group Topic/Focus:  Spirituality:   The focus of this group is to discuss how one's spirituality can aide in recovery.  Participation Level:  None  Participation Quality:  Appropriate and Attentive  Affect:  Appropriate and Flat  Cognitive:  Alert and Appropriate  Engagement in Group:  None  Additional Comments:  Pt attended non-denominational group discussing the article of "Dealing with Disappointment". Pt did not participate in group discussion but was attentive to the discussion.    Dalia Heading 01/10/2012, 11:13 AM

## 2012-01-10 NOTE — Progress Notes (Signed)
Patient ID: Tracy Gilbert, male   DOB: 1965/10/16, 46 y.o.   MRN: [id number]   Suicide Prevention discussion with Sidney Ace, pt fiancee. Reviewed the brochure and provided numbers to contact if there is a concern. Pt fiancee stated that pt had never had a problem with suicide or homicide in the past. Counselor discussed long term effects of drug addiction on the brain and mood instability related to depression and anxiety. Discussed with her monitoring his moods especially 6-9 months into recovery as this is a particularly difficult time in recovery. Fiancee agree to monitor this and make sure the home is secure from weapons and medications are secure if she has any concerns.   Hurley Cisco, MSW, LCSW Counselor

## 2012-01-10 NOTE — Progress Notes (Signed)
Pt is D/C home. Pt denies SI/HI/AV. Pt follow-up and medications were reviewed and pt verbalized understanding. Pt pleasant and cooperative. Pt belongings were returned.   

## 2012-01-10 NOTE — Progress Notes (Signed)
Patient ID: Tracy Gilbert, male   DOB: September 11, 1965, 46 y.o.   MRN: [id number]  Problem: Opioid Dependence  D: Pt isolative and remaining in his room with no peer interaction. Pt endorses depression.  A: Monitor Q 15 minutes for safety, encourage staff/peer interaction and group participation. Administer medications as ordered by MD.  R: Pt remains in his room, refusing to attend AA meeting. Pt compliant with HS medications. Pt denies SI or plans to harm himself.

## 2012-01-10 NOTE — Progress Notes (Signed)
Psychoeducational Group Note  Date:  01/09/2012 Time: 2100   Group Topic/Focus:  Speaker AA meeting  Participation Level: Did Not Attend  Participation Quality:  Not Applicable  Affect:  Not Applicable  Cognitive:  Not Applicable  Insight:  Not Applicable  Engagement in Group: Not Applicable  Additional Comments:  Pt notified that AA group was beginning and pt remained in bed.  Shelah Lewandowsky 01/10/2012, 12:59 AM

## 2012-01-10 NOTE — Progress Notes (Signed)
Psychoeducational Group Note  Date:  01/10/2012 Time:  1000am  Group Topic/Focus:  Making Healthy Choices:   The focus of this group is to help patients identify negative/unhealthy choices they were using prior to admission and identify positive/healthier coping strategies to replace them upon discharge.  Participation Level:  Did Not Attend  Participation Quality:   Tracy Gilbert 01/10/2012,11:34 AM

## 2012-01-10 NOTE — Progress Notes (Signed)
Patient ID: Tracy Gilbert, male   DOB: [birth year], 46 y.o.   MRN: [id number]  [facility] Group Notes:  (Counselor/Nursing/MHT/Case Management/Adjunct)  01/10/2012 1:15 PM  Type of Therapy:  Group Therapy, Dance/Movement Therapy   Participation Level:  Did Not Attend      Rhunette Croft 01/10/2012. 3:10 PM

## 2012-01-10 NOTE — Progress Notes (Signed)
Patient ID: Tracy Gilbert, male   DOB: [birth year], 46 y.o.   MRN: [id number] Pt. attended and participated in aftercare planning group. Pt. accepted information on suicide prevention, warning signs to look for with suicide and crisis line numbers to use. The pt. agreed to call crisis line numbers if having warning signs or having thoughts of suicide. Pt. listed their current anxiety level as 4 and depression level as a 3 on a scale of 1 to 10 with 10 being the high.  Pt. indicated that girlfriend Wynona Canes will be picking him up upon discharge which is scheduled for today.

## 2012-01-13 NOTE — Progress Notes (Signed)
Patient Discharge Instructions:  After Visit Summary (AVS):   Faxed to:  01/13/2012 Psychiatric Admission Assessment Note:   Faxed to:  01/13/2012 Suicide Risk Assessment - Discharge Assessment:   Faxed to:  01/13/2012 Faxed/Sent to the Next Level Care provider:  01/13/2012  Faxed to [facility] @ [phone]  Heloise Purpura, Eduard Clos, 01/13/2012, 12:36 PM

## 2012-01-15 ENCOUNTER — Emergency Department (HOSPITAL_COMMUNITY)
Admission: EM | Admit: 2012-01-15 | Discharge: 2012-01-15 | Disposition: A | Discharge status: Home / Self Care | Payer: BC Managed Care – PPO | Attending: Emergency Medicine | Admitting: Emergency Medicine

## 2012-01-15 ENCOUNTER — Emergency Department (HOSPITAL_COMMUNITY): Payer: BC Managed Care – PPO

## 2012-01-15 ENCOUNTER — Encounter (HOSPITAL_COMMUNITY): Payer: Self-pay | Admitting: *Deleted

## 2012-01-15 DIAGNOSIS — F329 Major depressive disorder, single episode, unspecified: Secondary | ICD-10-CM

## 2012-01-15 DIAGNOSIS — E785 Hyperlipidemia, unspecified: Secondary | ICD-10-CM | POA: Insufficient documentation

## 2012-01-15 DIAGNOSIS — F3289 Other specified depressive episodes: Secondary | ICD-10-CM | POA: Insufficient documentation

## 2012-01-15 DIAGNOSIS — F32A Depression, unspecified: Secondary | ICD-10-CM

## 2012-01-15 DIAGNOSIS — F17200 Nicotine dependence, unspecified, uncomplicated: Secondary | ICD-10-CM | POA: Insufficient documentation

## 2012-01-15 DIAGNOSIS — D72829 Elevated white blood cell count, unspecified: Secondary | ICD-10-CM

## 2012-01-15 DIAGNOSIS — Z79899 Other long term (current) drug therapy: Secondary | ICD-10-CM | POA: Insufficient documentation

## 2012-01-15 DIAGNOSIS — K219 Gastro-esophageal reflux disease without esophagitis: Secondary | ICD-10-CM | POA: Insufficient documentation

## 2012-01-15 DIAGNOSIS — R630 Anorexia: Secondary | ICD-10-CM

## 2012-01-15 HISTORY — DX: Opioid abuse, uncomplicated: F11.10

## 2012-01-15 HISTORY — DX: Gastro-esophageal reflux disease without esophagitis: K21.9

## 2012-01-15 HISTORY — DX: Other congenital malformations of musculoskeletal system: Q79.8

## 2012-01-15 HISTORY — DX: Hyperlipidemia, unspecified: E78.5

## 2012-01-15 HISTORY — DX: Essential (primary) hypertension: I10

## 2012-01-15 HISTORY — DX: Anxiety disorder, unspecified: F41.9

## 2012-01-15 LAB — URINALYSIS, ROUTINE W REFLEX MICROSCOPIC
Bilirubin Urine: NEGATIVE
Leukocytes, UA: NEGATIVE
Nitrite: NEGATIVE
Specific Gravity, Urine: 1.017 (ref 1.005–1.030)
Urobilinogen, UA: 0.2 mg/dL (ref 0.0–1.0)
pH: 6.5 (ref 5.0–8.0)

## 2012-01-15 LAB — BASIC METABOLIC PANEL
BUN: 4 mg/dL — ABNORMAL LOW (ref 6–23)
CO2: 29 mEq/L (ref 19–32)
Chloride: 99 mEq/L (ref 96–112)
Creatinine, Ser: 0.74 mg/dL (ref 0.50–1.35)
Glucose, Bld: 88 mg/dL (ref 70–99)

## 2012-01-15 LAB — CBC WITH DIFFERENTIAL/PLATELET
HCT: 40.8 % (ref 39.0–52.0)
Hemoglobin: 14.2 g/dL (ref 13.0–17.0)
Lymphocytes Relative: 15 % (ref 12–46)
Lymphs Abs: 2.4 10*3/uL (ref 0.7–4.0)
MCV: 90.1 fL (ref 78.0–100.0)
Monocytes Absolute: 0.9 10*3/uL (ref 0.1–1.0)
Monocytes Relative: 5 % (ref 3–12)
Neutro Abs: 13 10*3/uL — ABNORMAL HIGH (ref 1.7–7.7)
WBC: 16.4 10*3/uL — ABNORMAL HIGH (ref 4.0–10.5)

## 2012-01-15 MED ORDER — OMEPRAZOLE 40 MG PO CPDR: 40.0000 mg | DELAYED_RELEASE_CAPSULE | Freq: Every day | ORAL | Status: AC

## 2012-01-15 MED ORDER — SODIUM CHLORIDE 0.9 % IV BOLUS (SEPSIS)
1000.0000 mL | Freq: Once | INTRAVENOUS | Status: AC
  Administered 2012-01-15: 1000 mL via INTRAVENOUS

## 2012-01-15 MED ORDER — ATORVASTATIN CALCIUM 20 MG PO TABS: 20.0000 mg | ORAL_TABLET | Freq: Every day | ORAL | Status: AC

## 2012-01-15 MED ORDER — PAROXETINE HCL 20 MG PO TABS: 20.0000 mg | ORAL_TABLET | Freq: Every day | ORAL | Status: AC

## 2012-01-15 NOTE — ED Notes (Signed)
Pt denies si/hi. Pt states he is also having abdominal pain

## 2012-01-15 NOTE — ED Notes (Signed)
Tech went to lunch at 1500. EKG ordered at 1455. Pt was ready for discharge when RN noticed EKG not performed. Tech did EKG and gave to MD.

## 2012-01-15 NOTE — ED Provider Notes (Signed)
History     CSN: [id number]  Arrival date & time 01/15/12  1114   First MD Initiated Contact with Patient 01/15/12 1143      Chief Complaint  Patient presents with  . Generalized Body Aches  . Chills  . Fatigue  . Addiction Problem    (Consider location/radiation/quality/duration/timing/severity/associated sxs/prior treatment) Patient is a 46 y.o. male presenting with weakness. The history is provided by the patient.  Weakness The primary symptoms include dizziness. Primary symptoms do not include loss of consciousness, fever, nausea or vomiting.  Dizziness also occurs with weakness. Dizziness does not occur with nausea or vomiting.  Additional symptoms include weakness and dysphoric mood. Additional symptoms do not include hallucinations. Associated symptoms comments: He reports he has been in a program at [facility] to detox from an opiate dependence for the past 2 weeks. Since entering the program he reports feeling weak, dizzy, generally achy and has had a complete loss of appetite. No nausea or vomiting. No fever. He reports mild episodic bouts of confusion. No pain. Symptoms are unchanged from beginning of program and he became concerned and feels it is unrelated to having stopped taking opiates. He reports multiple medication changes while at [facility] and feels this complicates his clinical picture. He reports that he is significantly depressed,does not want to get out of the bed at any time, always tired. No SI/HI. He has no hallucinations. .    Past Medical History  Diagnosis Date  . Depression   . Anxiety   . Hypertension   . [place] syndrome   . GERD (gastroesophageal reflux disease)   . Hyperlipidemia   . Opiate abuse, continuous     Past Surgical History  Procedure Date  . Tonsillectomy   . Hand surgery     No family history on file.  History  Substance Use Topics  . Smoking status: Current Everyday Smoker -- 2.0 packs/day    Types: Cigarettes  .  Smokeless tobacco: Not on file  . Alcohol Use: No      Review of Systems  Constitutional: Positive for appetite change, fatigue and unexpected weight change. Negative for fever.  Respiratory: Negative for cough and shortness of breath.   Cardiovascular: Negative for chest pain and palpitations.  Gastrointestinal: Negative for nausea, vomiting and abdominal pain.  Genitourinary: Negative for dysuria.  Neurological: Positive for dizziness and weakness. Negative for loss of consciousness.  Psychiatric/Behavioral: Positive for dysphoric mood. Negative for suicidal ideas and hallucinations.    Allergies  Codeine  Home Medications   Current Outpatient Rx  Name Route Sig Dispense Refill  . ATORVASTATIN CALCIUM 20 MG PO TABS Oral Take 10 mg by mouth daily. For high cholesterol control    . OMEPRAZOLE 40 MG PO CPDR Oral Take 40 mg by mouth daily. For acid reflux    . PAROXETINE HCL 20 MG PO TABS Oral Take 20 mg by mouth daily. For depression      BP 127/84  Pulse 89  Temp 98.3 F (36.8 C) (Oral)  Resp 18  Ht 5\' 7"  (1.702 m)  Wt 140 lb (63.504 kg)  BMI 21.93 kg/m2  SpO2 99%  Physical Exam  Constitutional: He is oriented to person, place, and time. He appears well-developed and well-nourished. No distress.  HENT:  Head: Normocephalic and atraumatic.  Mouth/Throat: Oropharynx is clear and moist.  Eyes: Pupils are equal, round, and reactive to light.  Neck: Normal range of motion.  Cardiovascular: Normal rate and regular rhythm.  No murmur heard. Pulmonary/Chest: Effort normal and breath sounds normal. He has no wheezes. He has no rales.  Abdominal: Soft. Bowel sounds are normal. He exhibits no mass. There is no tenderness. There is no rebound and no guarding.  Musculoskeletal: Normal range of motion. He exhibits no edema.  Neurological: He is alert and oriented to person, place, and time.  Psychiatric: His speech is normal and behavior is normal. Judgment and thought content  normal. Cognition and memory are normal. He exhibits a depressed mood.    ED Course  Procedures (including critical care time)  Labs Reviewed  CBC WITH DIFFERENTIAL - Abnormal; Notable for the following:    WBC 16.4 (*)     Neutrophils Relative 79 (*)     Neutro Abs 13.0 (*)     All other components within normal limits  BASIC METABOLIC PANEL - Abnormal; Notable for the following:    Potassium 3.2 (*)     BUN 4 (*)     All other components within normal limits  URINALYSIS, ROUTINE W REFLEX MICROSCOPIC   Results for orders placed during the hospital encounter of 01/15/12  CBC WITH DIFFERENTIAL      Component Value Range   WBC 16.4 (*) 4.0 - 10.5 K/uL   RBC 4.53  4.22 - 5.81 MIL/uL   Hemoglobin 14.2  13.0 - 17.0 g/dL   HCT 16.1  09.6 - 04.5 %   MCV 90.1  78.0 - 100.0 fL   MCH 31.3  26.0 - 34.0 pg   MCHC 34.8  30.0 - 36.0 g/dL   RDW 40.9  81.1 - 91.4 %   Platelets 262  150 - 400 K/uL   Neutrophils Relative 79 (*) 43 - 77 %   Neutro Abs 13.0 (*) 1.7 - 7.7 K/uL   Lymphocytes Relative 15  12 - 46 %   Lymphs Abs 2.4  0.7 - 4.0 K/uL   Monocytes Relative 5  3 - 12 %   Monocytes Absolute 0.9  0.1 - 1.0 K/uL   Eosinophils Relative 1  0 - 5 %   Eosinophils Absolute 0.2  0.0 - 0.7 K/uL   Basophils Relative 0  0 - 1 %   Basophils Absolute 0.0  0.0 - 0.1 K/uL  BASIC METABOLIC PANEL      Component Value Range   Sodium 137  135 - 145 mEq/L   Potassium 3.2 (*) 3.5 - 5.1 mEq/L   Chloride 99  96 - 112 mEq/L   CO2 29  19 - 32 mEq/L   Glucose, Bld 88  70 - 99 mg/dL   BUN 4 (*) 6 - 23 mg/dL   Creatinine, Ser 7.82  0.50 - 1.35 mg/dL   Calcium 9.1  8.4 - 95.6 mg/dL   GFR calc non Af Amer >90  >90 mL/min   GFR calc Af Amer >90  >90 mL/min  URINALYSIS, ROUTINE W REFLEX MICROSCOPIC      Component Value Range   Color, Urine YELLOW  YELLOW   APPearance CLEAR  CLEAR   Specific Gravity, Urine 1.017  1.005 - 1.030   pH 6.5  5.0 - 8.0   Glucose, UA NEGATIVE  NEGATIVE mg/dL   Hgb urine  dipstick NEGATIVE  NEGATIVE   Bilirubin Urine NEGATIVE  NEGATIVE   Ketones, ur NEGATIVE  NEGATIVE mg/dL   Protein, ur NEGATIVE  NEGATIVE mg/dL   Urobilinogen, UA 0.2  0.0 - 1.0 mg/dL   Nitrite NEGATIVE  NEGATIVE   Leukocytes, UA  NEGATIVE  NEGATIVE    Dg Chest 2 View  01/15/2012  *RADIOLOGY REPORT*  Clinical Data: Generalized body aches, chills, fatigue  CHEST - 2 VIEW  Comparison: 12/21/2010  Findings: Grossly unchanged cardiac silhouette and mediastinal contours. The lungs remain hyperexpanded.  No focal parenchymal opacities.  No pleural effusion or pneumothorax.  No acute osseous abnormalities.  IMPRESSION: Hyperexpanded lungs without acute cardiopulmonary disease.  Original Report Authenticated By: Waynard Reeds, M.D.     No diagnosis found.  1. Leukocytosis 2. Depression   MDM  Leukocytosis that is improving comparatively to previous labs. He feels better after getting IV fluids and felt up to eating a meal, which is an improvement. No vomiting, nausea. Continues to be pain free. He does not want to go back to [facility]. Family at bedside and supports his going home with medical follow up for his symptoms.         Rodena Medin, PA-C 01/15/12 1617

## 2012-01-15 NOTE — ED Provider Notes (Signed)
Medical screening examination/treatment/procedure(s) were conducted as a shared visit with non-physician practitioner(s) and myself.  I personally evaluated the patient during the encounter  Recent d/c from opiate detox. Weakness, dizziness, achiness. No fever. Denies any injection drug use.  Endocarditis considered but no IVDA, murmur, fever, rash. Leukocytosis chronic problem per patient. Needs PCP followup Suspect symptoms likely attributable to depression.  Glynn Octave, MD 01/15/12 [phone]

## 2012-01-15 NOTE — ED Notes (Signed)
Pt states he is having body aches and chills. Pt states he has been at [facility] for [facility] and [facility]. Pt thinks nothing is working. Pt has not stated at this time her wants medical clarence. Pt states he want help for his body aches

## 2012-01-15 NOTE — ED Notes (Signed)
Per EMS. Pt is from [facility]. Pt was seen at [facility] a week ago for detox from opiates. Pt reports feeling weak and confused x2 weeks.  Pt alert and oriented for EMS.  Pt also complains of chills and body aches.

## 2012-01-15 NOTE — ED Notes (Signed)
Pt from [facility] (for [facility]pi[facility] Det[facility]), came to ED today due to worsening of his anxiety. sts he is about to have panic attack. Pt get treatment from [facility] but the facility keep changing his medication regime; just got recently changed to paxil and sts paxil isn't helpful. Pt feels like he is confuse but pt is alert, oriented x 4. Denied SI/HI nor any hallucinations.

## 2012-02-17 NOTE — Discharge Summary (Signed)
Physician Discharge Summary Note  Patient:  Tracy Gilbert is an 46 y.o., male MRN:  [id number] DOB:  10-22-1965 Patient phone:  [phone] (home)  Patient address:   [address] [place] [address],   Date of Admission:  01/05/2012 Date of Discharge: 01/10/12  Reason for Admission: Opiate detox  Discharge Diagnoses: Principal Problem:  *Opioid dependence   Axis Diagnosis:   AXIS Gilbert:  Opiod dependence AXIS II:  Deferred AXIS III:   Past Medical History  Diagnosis Date  . Depression   . Anxiety   . Hypertension   . [place] syndrome   . GERD (gastroesophageal reflux disease)   . Hyperlipidemia   . Opiate abuse, continuous    AXIS IV:  Opiate dependence AXIS V:  65  Level of Care:  [facility]  Hospital Course:  The patient presented voluntarily to the ED at [facility] requesting assistance with detox from "Oxycodone" stating...."Gilbert want my life back."  He has been using 5-10 15 mg of Oxycodone daily for the last 2 years. He denies any formal treatment for detox. He has tried to cut down. He also uses THC to relax qod for the last 1.5-2 years. He denies alcohol abuse. Prior to admission he reports increased sleeping all the time, appetite is down, depression is a 10/10. He denies SI/ with no previous attempts. No HI, NO AH/VH. He states his anxiety is off the charts at a 12/10, and he has some feelings of hopelessness at a 5/10.  He denies any previous suicide attempts, he currently sees a PA with Tracy Gilbert office and was recently placed on Vilazodone.   Upon admission in this hospital, Tracy Gilbert was started on clonidine protocol for his opiate detoxification. He was also enrolled in group counseling sessions and activities to learn coping skills. He also attended AA/NA meetings being offered and held in this unit. He has other previous and or identifiable medical conditions that required treatment and or monitoring . Therefore, he received treatment and was monitored closely for any  potential problems that may arise as of and or during detoxification treatment. Patient tolerated his treatment regimen without any significant adverse effects and or reactions.  Patient attended treatment team meeting this am and met with the team. His symptoms, substance abuse issues, response to to treatment and discharge plans discussed. Patient endorsed that he is doing well and stable for discharge to pursue the next phase of his substance abuse treatment.  It was agreed upon between patient and the team that he will be discharged to [facility]. Patient will leave [facility] facility and will go to this facility @ 2:30 pm for intake assessment. Upon discharge, patient adamantly denies suicidal, homicidal ideations, auditory, visual hallucinations, delusional thinking and or withdrawal symptoms. Patient left [facility] with all personal belongings in no apparent distress, Transportation per per family transport.    Consults:  None  Significant Diagnostic Studies:  labs: CBC with diff, CMP, toxicology  Discharge Vitals:   Blood pressure 111/81, pulse 92, temperature 98.5 F (36.9 C), temperature source Oral, resp. rate 24, height 5' 7.5" (1.715 m), weight 61.236 kg (135 lb).  Physical Findings: AIMS: Facial and Oral Movements Muscles of Facial Expression: None, normal Lips and Perioral Area: None, normal Jaw: None, normal Tongue: None, normal,Extremity Movements Upper (arms, wrists, hands, fingers): None, normal Lower (legs, knees, ankles, toes): None, normal, Trunk Movements Neck, shoulders, hips: None, normal, Overall Severity Severity of abnormal movements (highest score from  questions above): None, normal Incapacitation due to abnormal movements: None, normal Patient's awareness of abnormal movements (rate only patient's report): No Awareness, Dental Status Current problems with teeth and/or dentures?: No Does patient usually wear dentures?: No   CIWA:  CIWA-Ar Total: 0  COWS:  COWS Total Score: 0   Mental Status Exam: See Mental Status Examination and Suicide Risk Assessment completed by Attending Physician prior to discharge.  Discharge destination:  Other:  [facility]  Is patient on multiple antipsychotic therapies at discharge:  No   Has Patient had three or more failed trials of antipsychotic monotherapy by history:  No  Recommended Plan for Multiple Antipsychotic Therapies: NA   Medication List  As of 02/17/2012  4:38 PM   STOP taking these medications         atorvastatin 20 MG tablet      clonazePAM 1 MG tablet      omeprazole 40 MG capsule      oxyCODONE 15 MG immediate release tablet      QUEtiapine 100 MG tablet      Vilazodone HCl 10 MG Tabs           Follow-up Information    Follow up with [facility] on 01/10/2012. (Arrive at 2:30 for your intake)    Contact information:   [address]  [336] [phone]         Follow-up recommendations:  Activity:  as tolerated Other:  Keep all scheduled follow-up appointments as recommended.    Comments:  Take all your medications as prescribed by your mental healthcare provider. Report any adverse effects and or reactions from your medicines to your outpatient provider promptly. Patient is instructed and cautioned to not engage in alcohol and or illegal drug use while on prescription medicines. In the event of worsening symptoms, patient is instructed to call the crisis hotline, 911 and or go to the nearest ED for appropriate evaluation and treatment of symptoms. Follow-up with your primary care provider for your other medical issues, concerns and or health care needs.     SignedArmandina Stammer Gilbert 02/17/2012, 4:38 PM

## 2013-04-11 IMAGING — CR DG ELBOW 2V*R*
2 series · 2 of 2 positions shown · non-contrast
Comparison: None.

CLINICAL DATA: Chronic elbow pain, no injury

RIGHT ELBOW - 2 VIEW

[view not recorded (1 of 2)]
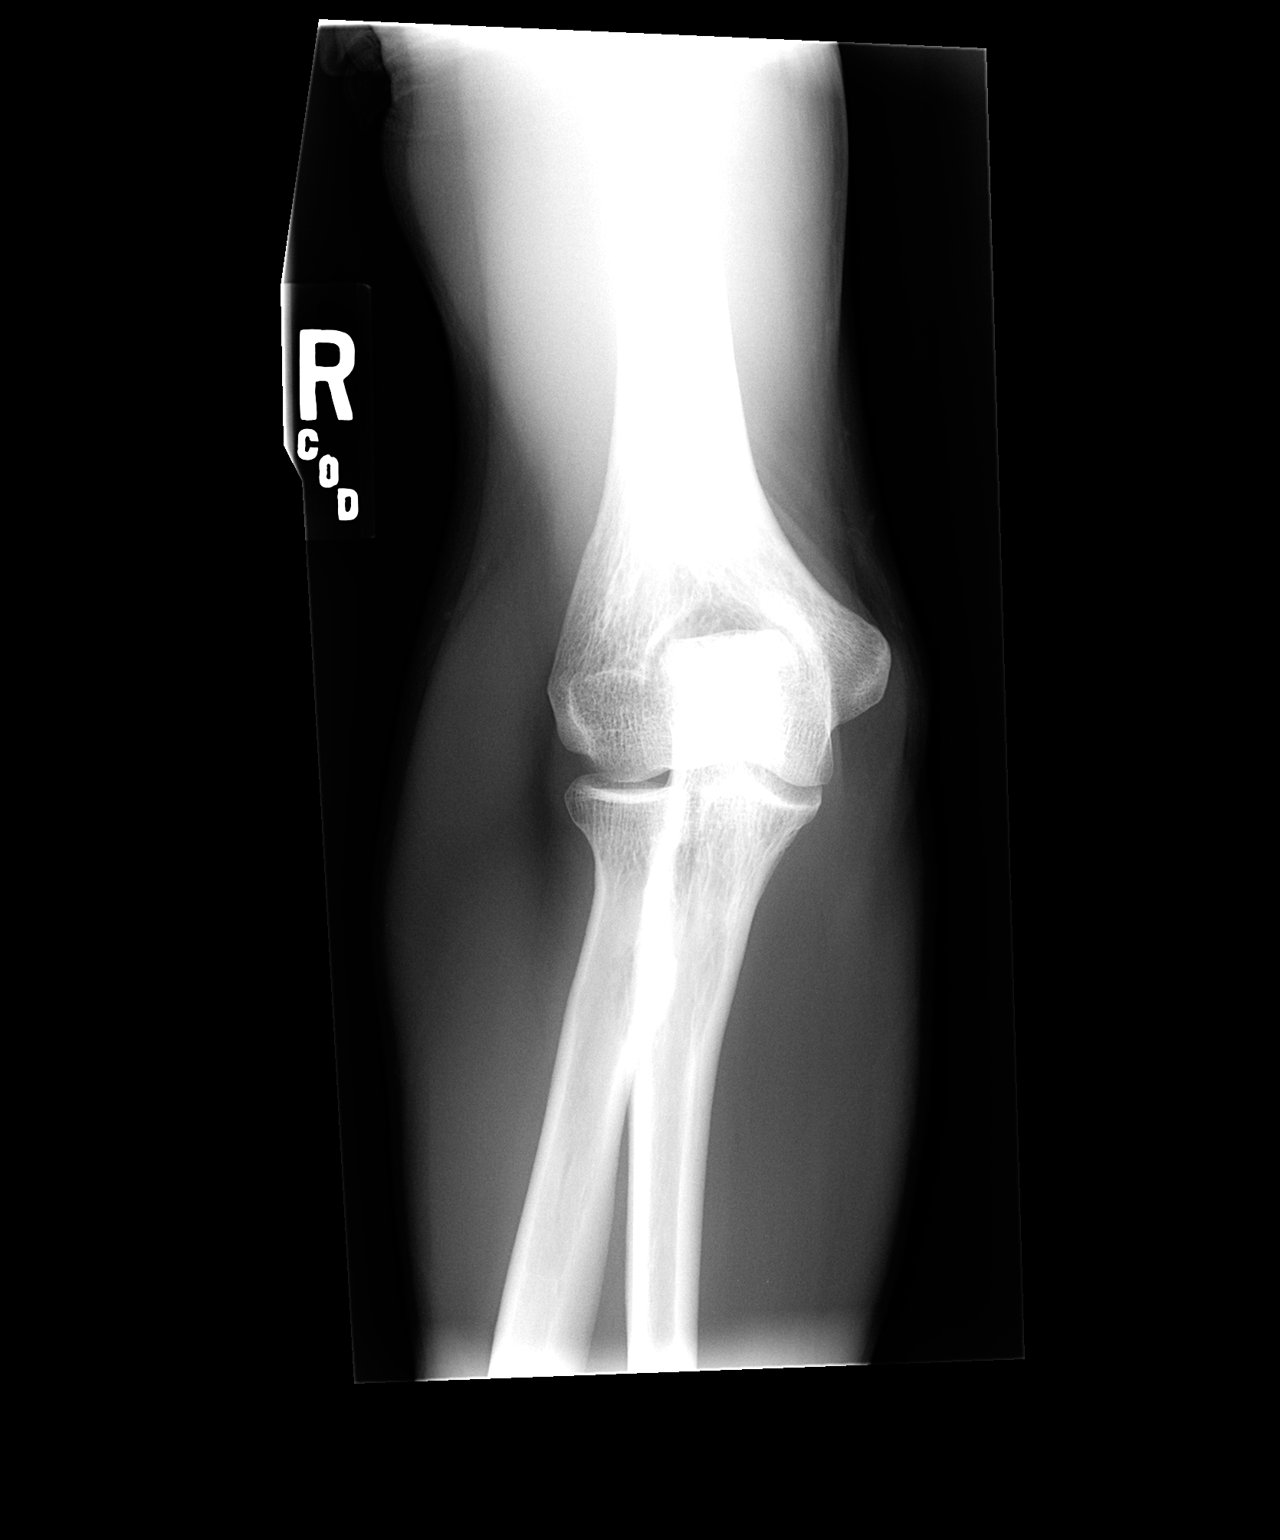

[view not recorded (2 of 2)]
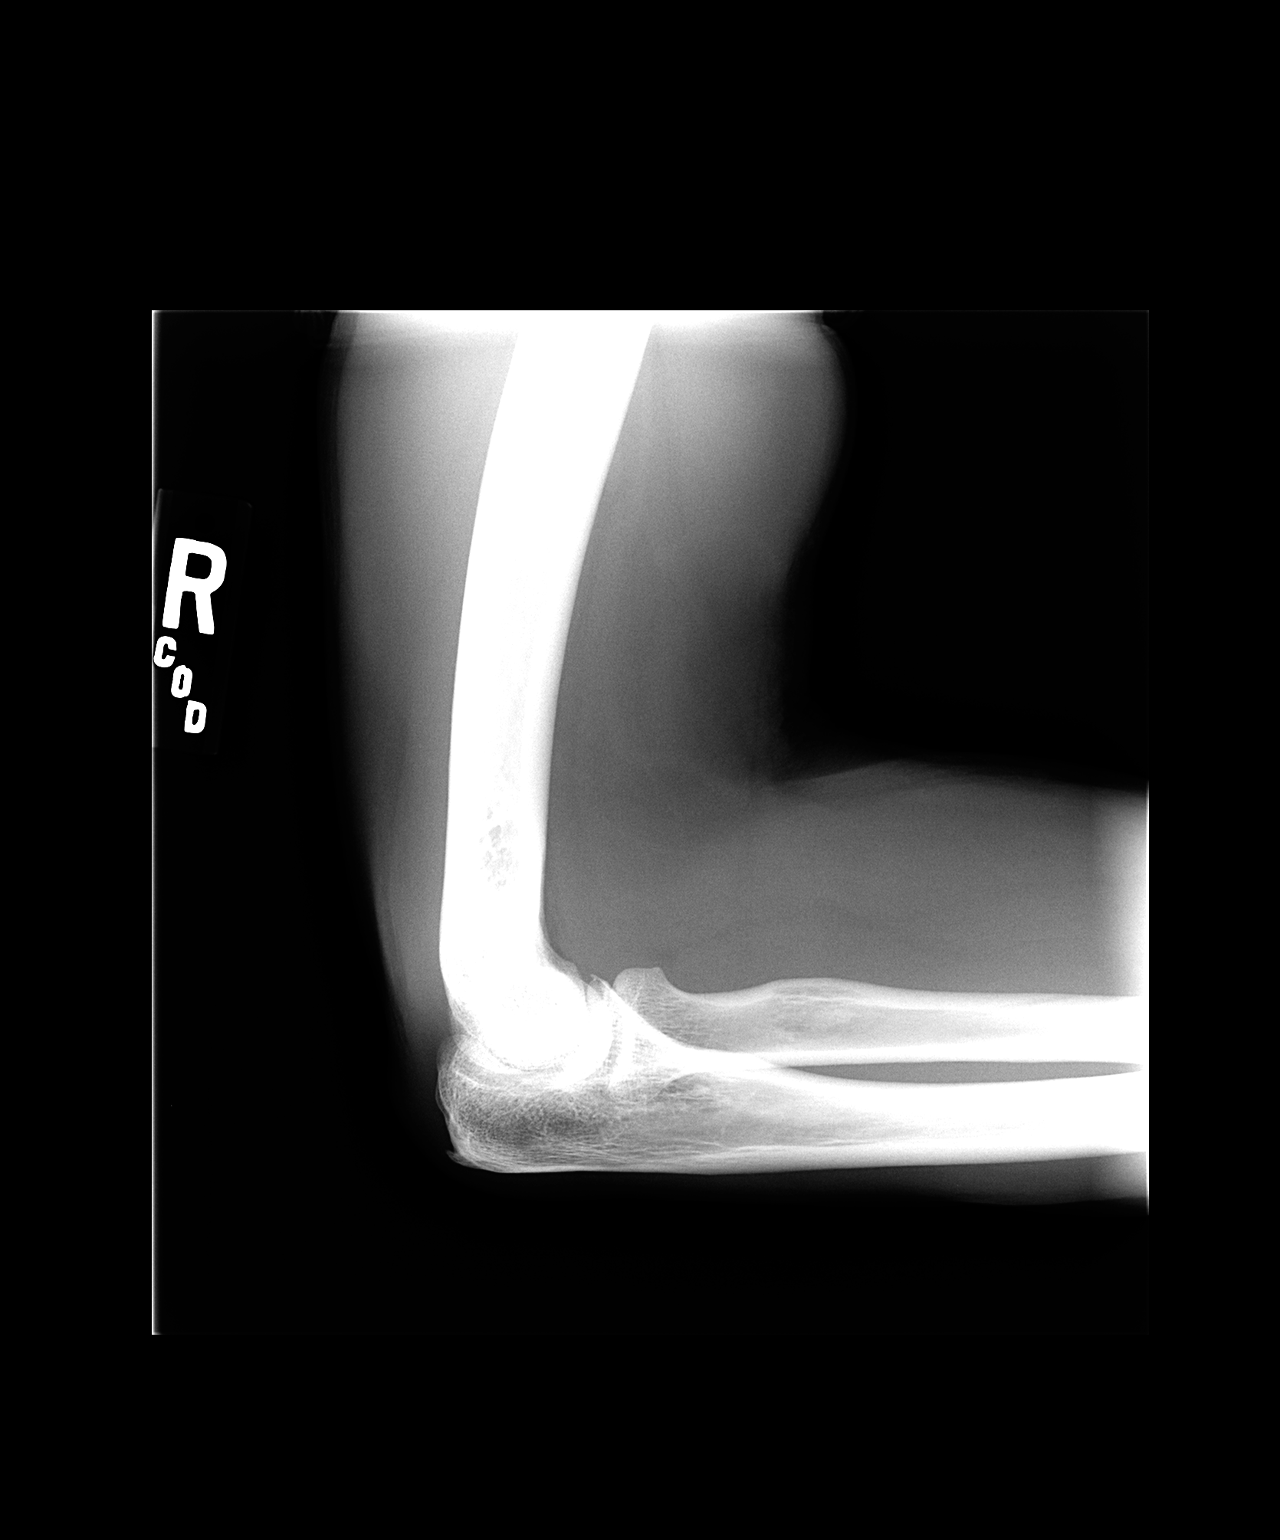

[2 of 2 positions shown; findings below may reference images not displayed]

FINDINGS: No acute fracture is seen.  Joint spaces appear normal.
No joint effusion is seen.  Only a small degenerative spur emanates
from the olecranon.
IMPRESSION: No acute abnormality.  No effusion.

## 2013-09-02 IMAGING — CR DG CHEST 2V
2 series · 2 of 2 positions shown · non-contrast
Comparison: 12/21/2010

CLINICAL DATA: Generalized body aches, chills, fatigue

CHEST - 2 VIEW

[w chest pa]
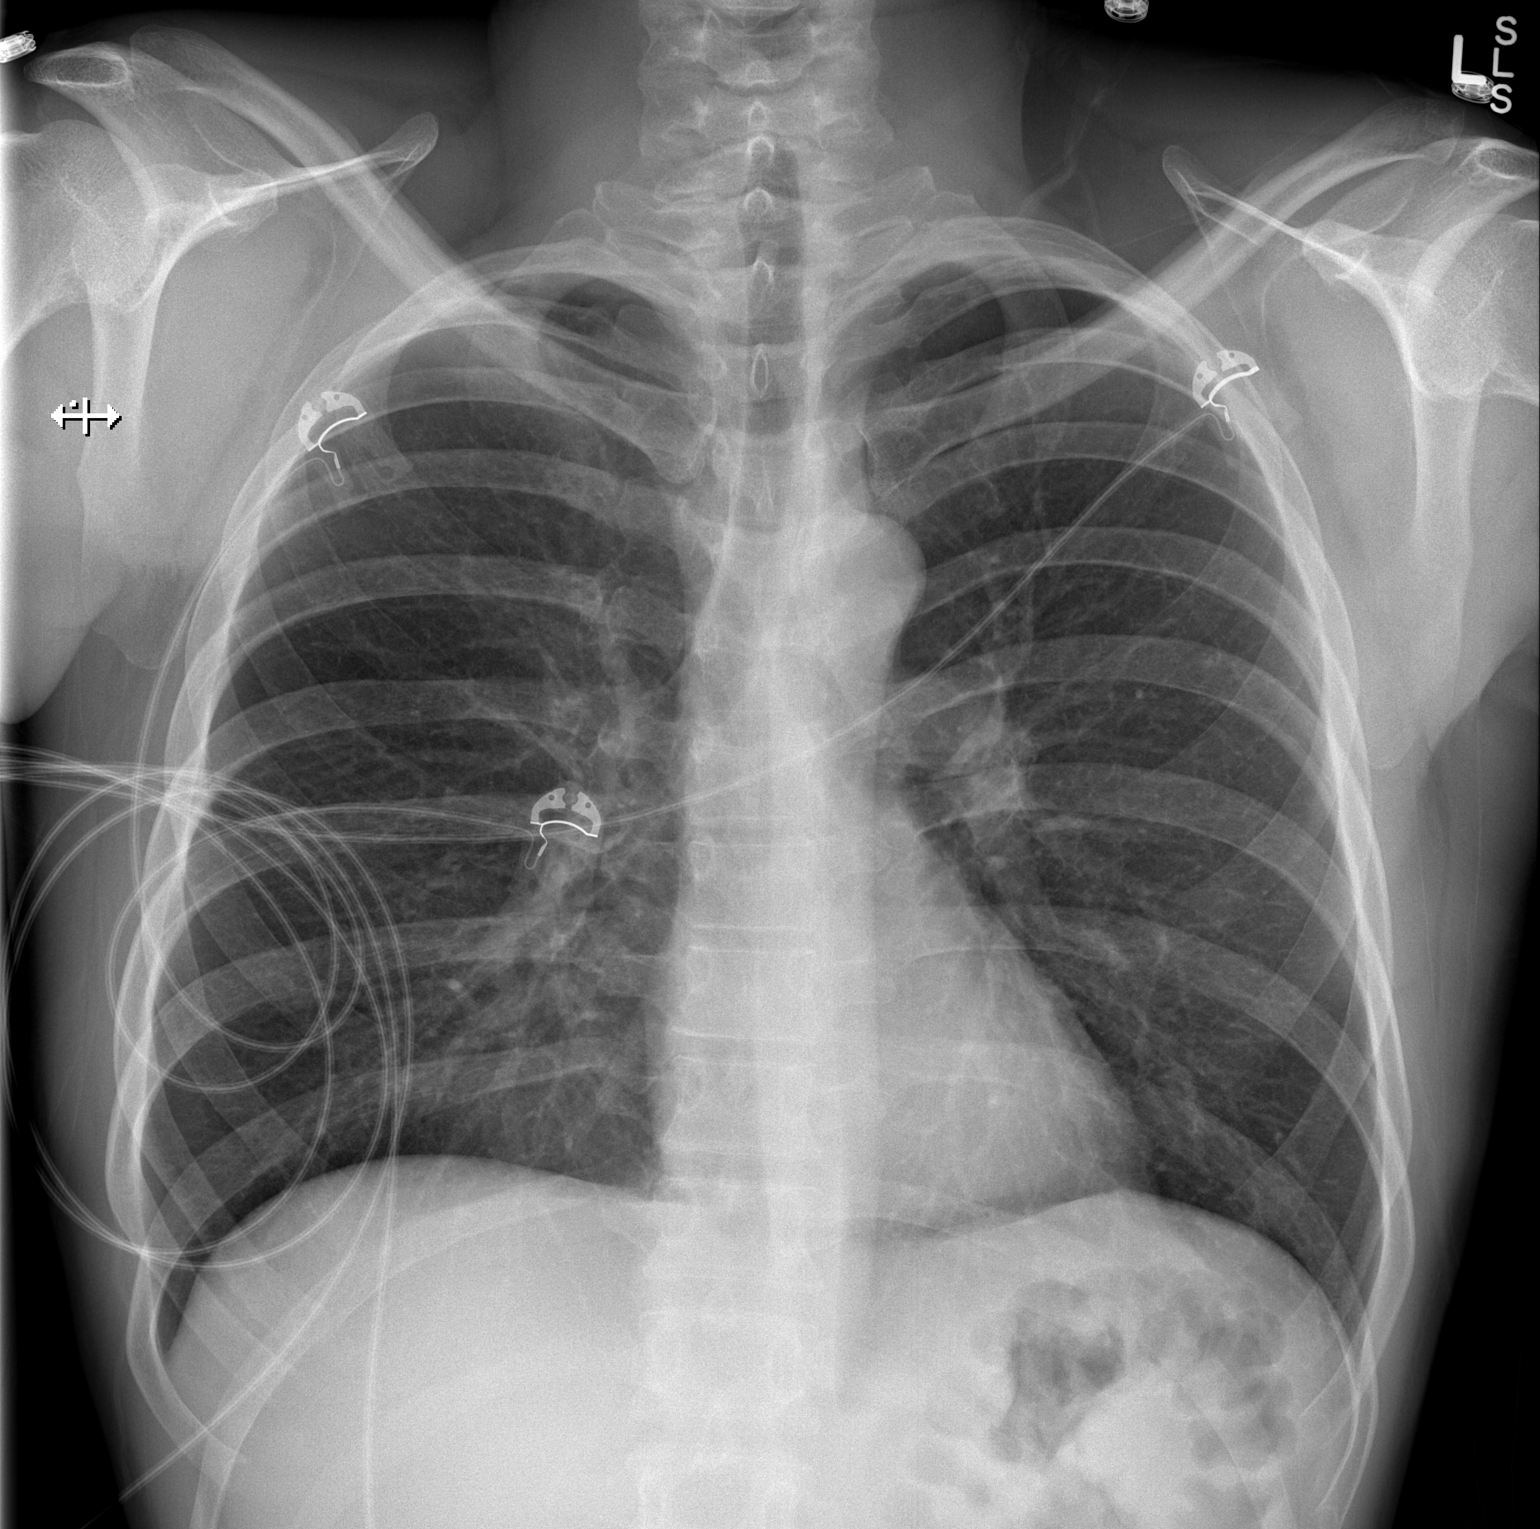

[w chest lat]
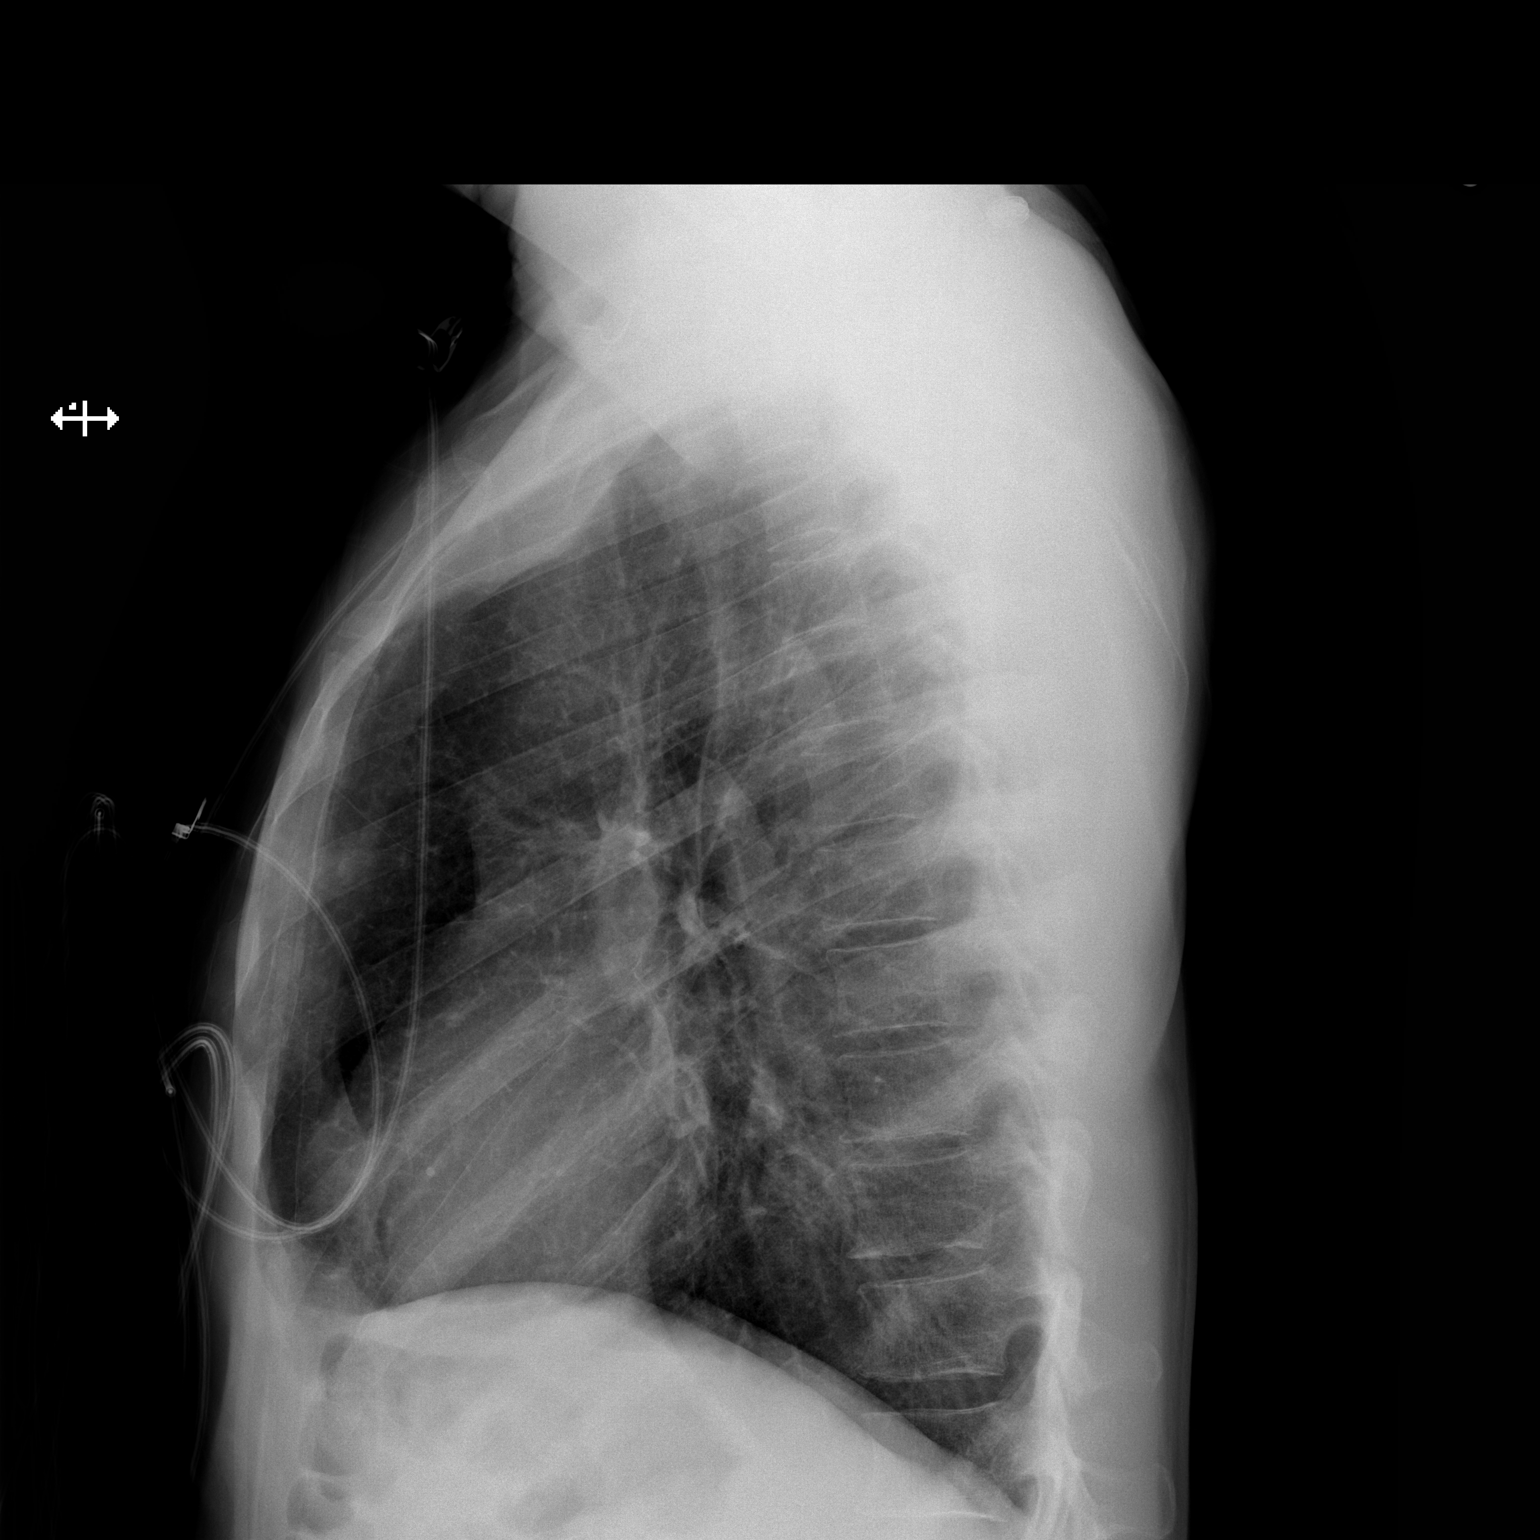

[2 of 2 positions shown; findings below may reference images not displayed]

FINDINGS: Grossly unchanged cardiac silhouette and mediastinal
contours. The lungs remain hyperexpanded.  No focal parenchymal
opacities.  No pleural effusion or pneumothorax.  No acute osseous
abnormalities.
IMPRESSION: Hyperexpanded lungs without acute cardiopulmonary disease.

## 2021-02-25 ENCOUNTER — Other Ambulatory Visit: Payer: Self-pay | Admitting: Registered Nurse

## 2021-02-25 ENCOUNTER — Other Ambulatory Visit (HOSPITAL_COMMUNITY): Payer: Self-pay | Admitting: Registered Nurse

## 2021-02-25 DIAGNOSIS — K8500 Idiopathic acute pancreatitis without necrosis or infection: Secondary | ICD-10-CM

## 2021-02-25 DIAGNOSIS — A0472 Enterocolitis due to Clostridium difficile, not specified as recurrent: Secondary | ICD-10-CM

## 2021-02-26 ENCOUNTER — Ambulatory Visit (HOSPITAL_COMMUNITY)
Admission: RE | Admit: 2021-02-26 | Discharge: 2021-02-26 | Disposition: A | Discharge status: Home / Self Care | Payer: 59 | Source: Ambulatory Visit | Attending: Registered Nurse | Admitting: Registered Nurse

## 2021-02-26 ENCOUNTER — Ambulatory Visit (HOSPITAL_COMMUNITY): Payer: Self-pay

## 2021-02-26 ENCOUNTER — Other Ambulatory Visit: Payer: Self-pay

## 2021-02-26 ENCOUNTER — Encounter (HOSPITAL_COMMUNITY): Payer: Self-pay

## 2021-02-26 DIAGNOSIS — A0472 Enterocolitis due to Clostridium difficile, not specified as recurrent: Secondary | ICD-10-CM | POA: Diagnosis present

## 2021-02-26 DIAGNOSIS — K8500 Idiopathic acute pancreatitis without necrosis or infection: Secondary | ICD-10-CM | POA: Insufficient documentation

## 2021-02-26 MED ORDER — IOHEXOL 350 MG/ML SOLN
75.0000 mL | Freq: Once | INTRAVENOUS | Status: AC | PRN
  Administered 2021-02-26: 75 mL via INTRAVENOUS

## 2021-02-26 MED ORDER — IOHEXOL 9 MG/ML PO SOLN
500.0000 mL | ORAL | Status: AC
  Administered 2021-02-26 (×2): 500 mL via ORAL

## 2021-02-26 MED ORDER — IOHEXOL 9 MG/ML PO SOLN
ORAL | Status: AC
  Filled 2021-02-26: qty 1000

## 2021-07-31 DIAGNOSIS — Z8719 Personal history of other diseases of the digestive system: Secondary | ICD-10-CM | POA: Diagnosis not present

## 2021-07-31 DIAGNOSIS — K5190 Ulcerative colitis, unspecified, without complications: Secondary | ICD-10-CM | POA: Diagnosis not present

## 2021-07-31 DIAGNOSIS — Z7689 Persons encountering health services in other specified circumstances: Secondary | ICD-10-CM | POA: Diagnosis not present

## 2021-07-31 DIAGNOSIS — E46 Unspecified protein-calorie malnutrition: Secondary | ICD-10-CM | POA: Diagnosis not present

## 2021-08-12 DIAGNOSIS — K5100 Ulcerative (chronic) pancolitis without complications: Secondary | ICD-10-CM | POA: Diagnosis not present

## 2021-11-04 DIAGNOSIS — R1084 Generalized abdominal pain: Secondary | ICD-10-CM | POA: Diagnosis not present

## 2021-11-04 DIAGNOSIS — R634 Abnormal weight loss: Secondary | ICD-10-CM | POA: Diagnosis not present

## 2021-11-04 DIAGNOSIS — K5190 Ulcerative colitis, unspecified, without complications: Secondary | ICD-10-CM | POA: Diagnosis not present

## 2021-11-04 DIAGNOSIS — Z8619 Personal history of other infectious and parasitic diseases: Secondary | ICD-10-CM | POA: Diagnosis not present

## 2021-12-23 DIAGNOSIS — K5190 Ulcerative colitis, unspecified, without complications: Secondary | ICD-10-CM | POA: Diagnosis not present

## 2021-12-23 DIAGNOSIS — R197 Diarrhea, unspecified: Secondary | ICD-10-CM | POA: Diagnosis not present

## 2022-01-05 DIAGNOSIS — K649 Unspecified hemorrhoids: Secondary | ICD-10-CM | POA: Diagnosis not present

## 2022-01-05 DIAGNOSIS — K5130 Ulcerative (chronic) rectosigmoiditis without complications: Secondary | ICD-10-CM | POA: Diagnosis not present

## 2022-01-05 DIAGNOSIS — K529 Noninfective gastroenteritis and colitis, unspecified: Secondary | ICD-10-CM | POA: Diagnosis not present

## 2022-01-05 DIAGNOSIS — K5150 Left sided colitis without complications: Secondary | ICD-10-CM | POA: Diagnosis not present

## 2022-01-05 DIAGNOSIS — K5140 Inflammatory polyps of colon without complications: Secondary | ICD-10-CM | POA: Diagnosis not present

## 2022-01-05 DIAGNOSIS — K635 Polyp of colon: Secondary | ICD-10-CM | POA: Diagnosis not present

## 2022-02-10 DIAGNOSIS — K5150 Left sided colitis without complications: Secondary | ICD-10-CM | POA: Diagnosis not present

## 2022-02-17 DIAGNOSIS — K5150 Left sided colitis without complications: Secondary | ICD-10-CM | POA: Diagnosis not present

## 2022-02-17 DIAGNOSIS — R1012 Left upper quadrant pain: Secondary | ICD-10-CM | POA: Diagnosis not present

## 2022-02-26 DIAGNOSIS — I700 Atherosclerosis of aorta: Secondary | ICD-10-CM | POA: Diagnosis not present

## 2022-03-17 DIAGNOSIS — K5150 Left sided colitis without complications: Secondary | ICD-10-CM | POA: Diagnosis not present

## 2022-04-07 DIAGNOSIS — D500 Iron deficiency anemia secondary to blood loss (chronic): Secondary | ICD-10-CM | POA: Diagnosis not present

## 2022-04-07 DIAGNOSIS — K861 Other chronic pancreatitis: Secondary | ICD-10-CM | POA: Diagnosis not present

## 2022-04-08 DIAGNOSIS — K861 Other chronic pancreatitis: Secondary | ICD-10-CM | POA: Diagnosis not present

## 2022-04-22 DIAGNOSIS — K5150 Left sided colitis without complications: Secondary | ICD-10-CM | POA: Diagnosis not present

## 2022-04-22 DIAGNOSIS — D510 Vitamin B12 deficiency anemia due to intrinsic factor deficiency: Secondary | ICD-10-CM | POA: Diagnosis not present

## 2022-04-30 DIAGNOSIS — D510 Vitamin B12 deficiency anemia due to intrinsic factor deficiency: Secondary | ICD-10-CM | POA: Diagnosis not present

## 2022-06-18 DIAGNOSIS — D510 Vitamin B12 deficiency anemia due to intrinsic factor deficiency: Secondary | ICD-10-CM | POA: Diagnosis not present

## 2022-06-18 DIAGNOSIS — F339 Major depressive disorder, recurrent, unspecified: Secondary | ICD-10-CM | POA: Diagnosis not present

## 2022-06-18 DIAGNOSIS — K5190 Ulcerative colitis, unspecified, without complications: Secondary | ICD-10-CM | POA: Diagnosis not present

## 2022-06-18 DIAGNOSIS — Z0000 Encounter for general adult medical examination without abnormal findings: Secondary | ICD-10-CM | POA: Diagnosis not present

## 2022-07-20 DIAGNOSIS — R197 Diarrhea, unspecified: Secondary | ICD-10-CM | POA: Diagnosis not present

## 2022-07-27 DIAGNOSIS — K5150 Left sided colitis without complications: Secondary | ICD-10-CM | POA: Diagnosis not present

## 2022-07-27 DIAGNOSIS — D510 Vitamin B12 deficiency anemia due to intrinsic factor deficiency: Secondary | ICD-10-CM | POA: Diagnosis not present

## 2022-08-03 DIAGNOSIS — K5150 Left sided colitis without complications: Secondary | ICD-10-CM | POA: Diagnosis not present

## 2022-08-17 DIAGNOSIS — K5180 Other ulcerative colitis without complications: Secondary | ICD-10-CM | POA: Diagnosis not present

## 2022-08-17 DIAGNOSIS — T50905A Adverse effect of unspecified drugs, medicaments and biological substances, initial encounter: Secondary | ICD-10-CM | POA: Diagnosis not present

## 2022-09-10 DIAGNOSIS — A0471 Enterocolitis due to Clostridium difficile, recurrent: Secondary | ICD-10-CM | POA: Diagnosis not present

## 2022-09-10 DIAGNOSIS — K51919 Ulcerative colitis, unspecified with unspecified complications: Secondary | ICD-10-CM | POA: Diagnosis not present

## 2022-09-10 DIAGNOSIS — M818 Other osteoporosis without current pathological fracture: Secondary | ICD-10-CM | POA: Diagnosis not present

## 2022-09-17 DIAGNOSIS — F4321 Adjustment disorder with depressed mood: Secondary | ICD-10-CM | POA: Diagnosis not present

## 2022-09-17 DIAGNOSIS — K5190 Ulcerative colitis, unspecified, without complications: Secondary | ICD-10-CM | POA: Diagnosis not present

## 2022-09-17 DIAGNOSIS — E538 Deficiency of other specified B group vitamins: Secondary | ICD-10-CM | POA: Diagnosis not present

## 2022-09-17 DIAGNOSIS — K861 Other chronic pancreatitis: Secondary | ICD-10-CM | POA: Diagnosis not present

## 2022-09-17 DIAGNOSIS — D510 Vitamin B12 deficiency anemia due to intrinsic factor deficiency: Secondary | ICD-10-CM | POA: Diagnosis not present

## 2022-09-24 DIAGNOSIS — K5100 Ulcerative (chronic) pancolitis without complications: Secondary | ICD-10-CM | POA: Diagnosis not present

## 2022-09-24 DIAGNOSIS — K51919 Ulcerative colitis, unspecified with unspecified complications: Secondary | ICD-10-CM | POA: Diagnosis not present

## 2022-10-13 DIAGNOSIS — K5100 Ulcerative (chronic) pancolitis without complications: Secondary | ICD-10-CM | POA: Diagnosis not present

## 2022-10-13 DIAGNOSIS — K51919 Ulcerative colitis, unspecified with unspecified complications: Secondary | ICD-10-CM | POA: Diagnosis not present

## 2023-01-04 DIAGNOSIS — R0981 Nasal congestion: Secondary | ICD-10-CM | POA: Diagnosis not present

## 2023-01-04 DIAGNOSIS — R051 Acute cough: Secondary | ICD-10-CM | POA: Diagnosis not present

## 2023-01-04 DIAGNOSIS — J324 Chronic pansinusitis: Secondary | ICD-10-CM | POA: Diagnosis not present

## 2023-01-19 DIAGNOSIS — Z79899 Other long term (current) drug therapy: Secondary | ICD-10-CM | POA: Diagnosis not present

## 2023-01-19 DIAGNOSIS — F411 Generalized anxiety disorder: Secondary | ICD-10-CM | POA: Diagnosis not present

## 2023-01-19 DIAGNOSIS — K5190 Ulcerative colitis, unspecified, without complications: Secondary | ICD-10-CM | POA: Diagnosis not present

## 2023-01-19 DIAGNOSIS — E538 Deficiency of other specified B group vitamins: Secondary | ICD-10-CM | POA: Diagnosis not present

## 2023-01-20 ENCOUNTER — Telehealth: Payer: Self-pay | Admitting: *Deleted

## 2023-01-20 NOTE — Progress Notes (Signed)
  Care Coordination   Note   01/20/2023 Name: Tracy Gilbert MRN: [id number] DOB: 1965/07/02  Tracy Gilbert is a 57 y.o. year old male who sees Ninfa Meeker, FNP (Inactive) for primary care. I reached out to Mikael Spray by phone today to offer care coordination services.  Patient has [employer]. Routing to [employer] team to schedule.  [facility]  Care Coordination Care Guide  Direct Dial: [phone]

## 2023-01-25 ENCOUNTER — Other Ambulatory Visit: Payer: 59 | Admitting: *Deleted

## 2023-01-25 NOTE — Patient Outreach (Signed)
Care Coordination  01/25/2023  Tracy Gilbert 1966/05/29 [id number]  Successful telephone outreach with Tracy Gilbert today. Upon check in [facility] made aware of patient's PCP, which is not a [facility] provider. RNCM advised patient to contact [employer] [phone] and provide updated PCP information. RNCM explained the importance of providing health plan with updated information and requesting case management services. Patient voiced understanding.   Estanislado Emms RN, BSN [place]  Managed [facility] RN Care Coordinator [phone]
